# Patient Record
Sex: Male | Born: 1955 | Race: Black or African American | Hispanic: No | Smoking: Never smoker
Health system: Southern US, Community
[De-identification: ages and names within clinical notes are randomized; demographics above are authoritative.]

## PROBLEM LIST (undated history)

## (undated) DIAGNOSIS — M1990 Unspecified osteoarthritis, unspecified site: Secondary | ICD-10-CM

## (undated) DIAGNOSIS — Z8489 Family history of other specified conditions: Secondary | ICD-10-CM

## (undated) DIAGNOSIS — I10 Essential (primary) hypertension: Secondary | ICD-10-CM

## (undated) DIAGNOSIS — M549 Dorsalgia, unspecified: Secondary | ICD-10-CM

## (undated) DIAGNOSIS — E7800 Pure hypercholesterolemia, unspecified: Secondary | ICD-10-CM

## (undated) DIAGNOSIS — G4730 Sleep apnea, unspecified: Secondary | ICD-10-CM

## (undated) HISTORY — PX: OTHER SURGICAL HISTORY: SHX169

## (undated) HISTORY — PX: COLONOSCOPY W/ POLYPECTOMY: SHX1380

## (undated) HISTORY — PX: NO PAST SURGERIES: SHX2092

---

## 2010-03-12 ENCOUNTER — Emergency Department (HOSPITAL_COMMUNITY): Payer: Medicare Other

## 2010-03-12 ENCOUNTER — Emergency Department (HOSPITAL_COMMUNITY)
Admission: EM | Admit: 2010-03-12 | Discharge: 2010-03-12 | Disposition: A | Discharge status: Home / Self Care | Payer: Medicare Other | Attending: Emergency Medicine | Admitting: Emergency Medicine

## 2010-03-12 DIAGNOSIS — R0602 Shortness of breath: Secondary | ICD-10-CM | POA: Insufficient documentation

## 2010-03-12 DIAGNOSIS — Z79899 Other long term (current) drug therapy: Secondary | ICD-10-CM | POA: Insufficient documentation

## 2010-03-12 DIAGNOSIS — R509 Fever, unspecified: Secondary | ICD-10-CM | POA: Insufficient documentation

## 2010-03-12 DIAGNOSIS — R059 Cough, unspecified: Secondary | ICD-10-CM | POA: Insufficient documentation

## 2010-03-12 DIAGNOSIS — E119 Type 2 diabetes mellitus without complications: Secondary | ICD-10-CM | POA: Insufficient documentation

## 2010-03-12 DIAGNOSIS — E039 Hypothyroidism, unspecified: Secondary | ICD-10-CM | POA: Insufficient documentation

## 2010-03-12 DIAGNOSIS — IMO0001 Reserved for inherently not codable concepts without codable children: Secondary | ICD-10-CM | POA: Insufficient documentation

## 2010-03-12 DIAGNOSIS — R05 Cough: Secondary | ICD-10-CM | POA: Insufficient documentation

## 2010-03-12 DIAGNOSIS — I10 Essential (primary) hypertension: Secondary | ICD-10-CM | POA: Insufficient documentation

## 2010-03-12 LAB — DIFFERENTIAL
Lymphs Abs: 0.9 10*3/uL (ref 0.7–4.0)
Monocytes Absolute: 0.3 10*3/uL (ref 0.1–1.0)
Monocytes Relative: 9 % (ref 3–12)
Neutro Abs: 2.3 10*3/uL (ref 1.7–7.7)
Neutrophils Relative %: 63 % (ref 43–77)

## 2010-03-12 LAB — CBC
HCT: 37.4 % — ABNORMAL LOW (ref 39.0–52.0)
Hemoglobin: 12.8 g/dL — ABNORMAL LOW (ref 13.0–17.0)
MCH: 29.9 pg (ref 26.0–34.0)
MCV: 87.4 fL (ref 78.0–100.0)
RBC: 4.28 MIL/uL (ref 4.22–5.81)

## 2010-03-12 LAB — COMPREHENSIVE METABOLIC PANEL
ALT: 32 U/L (ref 0–53)
AST: 48 U/L — ABNORMAL HIGH (ref 0–37)
Albumin: 3.6 g/dL (ref 3.5–5.2)
Alkaline Phosphatase: 92 U/L (ref 39–117)
Chloride: 100 mEq/L (ref 96–112)
Creatinine, Ser: 1.01 mg/dL (ref 0.4–1.5)
GFR calc Af Amer: >60 mL/min (ref >60)
Potassium: 3.8 mEq/L (ref 3.5–5.1)
Sodium: 135 mEq/L (ref 135–145)
Total Bilirubin: 0.7 mg/dL (ref 0.3–1.2)

## 2010-03-12 LAB — POCT CARDIAC MARKERS: Troponin i, poc: <0.05 ng/mL (ref 0.00–0.09)

## 2010-04-14 ENCOUNTER — Emergency Department (HOSPITAL_COMMUNITY)
Admission: EM | Admit: 2010-04-14 | Discharge: 2010-04-15 | Disposition: A | Discharge status: Home / Self Care | Payer: Medicare Other | Attending: Emergency Medicine | Admitting: Emergency Medicine

## 2010-04-14 DIAGNOSIS — R221 Localized swelling, mass and lump, neck: Secondary | ICD-10-CM | POA: Insufficient documentation

## 2010-04-14 DIAGNOSIS — Y92009 Unspecified place in unspecified non-institutional (private) residence as the place of occurrence of the external cause: Secondary | ICD-10-CM | POA: Insufficient documentation

## 2010-04-14 DIAGNOSIS — R51 Headache: Secondary | ICD-10-CM | POA: Insufficient documentation

## 2010-04-14 DIAGNOSIS — F1010 Alcohol abuse, uncomplicated: Secondary | ICD-10-CM | POA: Insufficient documentation

## 2010-04-14 DIAGNOSIS — R220 Localized swelling, mass and lump, head: Secondary | ICD-10-CM | POA: Insufficient documentation

## 2010-04-14 DIAGNOSIS — S022XXA Fracture of nasal bones, initial encounter for closed fracture: Secondary | ICD-10-CM | POA: Insufficient documentation

## 2010-04-14 DIAGNOSIS — I10 Essential (primary) hypertension: Secondary | ICD-10-CM | POA: Insufficient documentation

## 2010-04-15 ENCOUNTER — Emergency Department (HOSPITAL_COMMUNITY): Payer: Medicare Other

## 2010-08-08 ENCOUNTER — Emergency Department (HOSPITAL_COMMUNITY)
Admission: EM | Admit: 2010-08-08 | Discharge: 2010-08-08 | Disposition: A | Discharge status: Home / Self Care | Payer: Medicare Other | Attending: Emergency Medicine | Admitting: Emergency Medicine

## 2010-08-08 DIAGNOSIS — IMO0002 Reserved for concepts with insufficient information to code with codable children: Secondary | ICD-10-CM | POA: Insufficient documentation

## 2010-08-08 DIAGNOSIS — I10 Essential (primary) hypertension: Secondary | ICD-10-CM | POA: Insufficient documentation

## 2010-08-08 DIAGNOSIS — T18108A Unspecified foreign body in esophagus causing other injury, initial encounter: Secondary | ICD-10-CM | POA: Insufficient documentation

## 2010-08-08 DIAGNOSIS — E119 Type 2 diabetes mellitus without complications: Secondary | ICD-10-CM | POA: Insufficient documentation

## 2011-06-02 ENCOUNTER — Emergency Department (HOSPITAL_COMMUNITY)
Admission: EM | Admit: 2011-06-02 | Discharge: 2011-06-02 | Disposition: A | Discharge status: Home / Self Care | Payer: Medicare Other | Attending: Emergency Medicine | Admitting: Emergency Medicine

## 2011-06-02 ENCOUNTER — Encounter (HOSPITAL_COMMUNITY): Payer: Self-pay | Admitting: *Deleted

## 2011-06-02 DIAGNOSIS — E119 Type 2 diabetes mellitus without complications: Secondary | ICD-10-CM | POA: Diagnosis not present

## 2011-06-02 DIAGNOSIS — R0989 Other specified symptoms and signs involving the circulatory and respiratory systems: Secondary | ICD-10-CM

## 2011-06-02 DIAGNOSIS — F449 Dissociative and conversion disorder, unspecified: Secondary | ICD-10-CM | POA: Diagnosis not present

## 2011-06-02 DIAGNOSIS — I10 Essential (primary) hypertension: Secondary | ICD-10-CM | POA: Diagnosis not present

## 2011-06-02 DIAGNOSIS — Z79899 Other long term (current) drug therapy: Secondary | ICD-10-CM | POA: Diagnosis not present

## 2011-06-02 DIAGNOSIS — E079 Disorder of thyroid, unspecified: Secondary | ICD-10-CM | POA: Insufficient documentation

## 2011-06-02 DIAGNOSIS — R6889 Other general symptoms and signs: Secondary | ICD-10-CM | POA: Diagnosis not present

## 2011-06-02 DIAGNOSIS — D649 Anemia, unspecified: Secondary | ICD-10-CM | POA: Insufficient documentation

## 2011-06-02 DIAGNOSIS — E7800 Pure hypercholesterolemia, unspecified: Secondary | ICD-10-CM | POA: Diagnosis not present

## 2011-06-02 HISTORY — DX: Essential (primary) hypertension: I10

## 2011-06-02 HISTORY — DX: Pure hypercholesterolemia, unspecified: E78.00

## 2011-06-02 NOTE — ED Notes (Signed)
Pt states he noticed felt like something stuck in throat last night.  EMS was called out but did not transport due to maintaining O2 sats and able to swallow and talk.  Pt alert and oriented, speaking in full sentences.

## 2011-06-02 NOTE — ED Provider Notes (Signed)
History     CSN: [id number]  Arrival date & time 06/02/11  7846   First MD Initiated Contact with Patient 06/02/11 0700      7:02 AM HPI Eric Wood is 56 y.o. male complaining of the sensation of a foreign body in his throat. Reports last night he woke up to drink some water. States after that is when his throat began to feel irritated. Reports he called EMS but since he was able to drink and swallow without difficulty he was not transported by EMS. States that he came to the ED because EMS did not look in his throat. Reports similar episode several years ago. States he had a pill in his throat and had been advised to drink hot water.  Patient is a 56 y.o. male presenting with foreign body swallowed. The history is provided by the patient.  Swallowed Foreign Body This is a new problem. The current episode started today. The problem has been unchanged. Associated symptoms include abdominal pain. Pertinent negatives include no congestion, coughing, fever, nausea, neck pain, rash, sore throat, swollen glands or vomiting. The symptoms are aggravated by swallowing. He has tried drinking for the symptoms. The treatment provided no relief.    Past Medical History  Diagnosis Date  . Diabetes mellitus   . Hypertension   . Thyroid disease   . Anemia   . Hypercholesteremia     History reviewed. No pertinent past surgical history.  History reviewed. No pertinent family history.  History  Substance Use Topics  . Smoking status: Never Smoker   . Smokeless tobacco: Not on file  . Alcohol Use: Yes      Review of Systems  Constitutional: Negative for fever.  HENT: Positive for trouble swallowing. Negative for congestion, sore throat, drooling, mouth sores, neck pain and voice change.   Respiratory: Negative for cough.   Gastrointestinal: Positive for abdominal pain. Negative for nausea and vomiting.  Skin: Negative for rash.  All other systems reviewed and are negative.    Allergies    Review of patient's allergies indicates no known allergies.  Home Medications   Current Outpatient Rx  Name Route Sig Dispense Refill  . ATENOLOL 25 MG PO TABS Oral Take 25 mg by mouth daily.    Marland Kitchen DICLOFENAC SODIUM 75 MG PO TBEC Oral Take 75 mg by mouth 2 (two) times daily as needed. Pain    . GLIPIZIDE 10 MG PO TABS Oral Take 10 mg by mouth 2 (two) times daily before a meal.    . LEVOTHYROXINE SODIUM 25 MCG PO TABS Oral Take 25 mcg by mouth daily.    Marland Kitchen LISINOPRIL 10 MG PO TABS Oral Take 20 mg by mouth daily.    Marland Kitchen METFORMIN HCL 500 MG PO TABS Oral Take 500 mg by mouth 2 (two) times daily with a meal.    . SIMVASTATIN 10 MG PO TABS Oral Take 10 mg by mouth at bedtime.      BP 170/96  Pulse 112  Temp(Src) 98.2 F (36.8 C) (Oral)  Resp 20  SpO2 98%  Physical Exam  Constitutional: He is oriented to person, place, and time. He appears well-developed and well-nourished.  HENT:  Head: Normocephalic and atraumatic.  Right Ear: Tympanic membrane, external ear and ear canal normal.  Left Ear: Tympanic membrane, external ear and ear canal normal.  Nose: Nose normal.  Mouth/Throat: Uvula is midline, oropharynx is clear and moist and mucous membranes are normal. Normal dentition. No dental abscesses or uvula  swelling. No oropharyngeal exudate, posterior oropharyngeal edema or posterior oropharyngeal erythema.  Eyes: Pupils are equal, round, and reactive to light.  Neck: No muscular tenderness present.  Neurological: He is alert and oriented to person, place, and time.  Skin: Skin is warm and dry. No rash noted. No erythema. No pallor.  Psychiatric: He has a normal mood and affect. His behavior is normal.    ED Course  Procedures  Results for orders placed during the hospital encounter of 06/02/11  RAPID STREP SCREEN      Component Value Range   Streptococcus, Group A Screen (Direct) NEGATIVE  NEGATIVE     MDM   Discussed with patient that imaging will not pick up on food products.  Advised that he continues to drink water and that rapid strep was negative. Advised f/u with PCP or GI if he continues to have symptoms. Pt voices understanding and is ready for d/c     Thomasene Lot, PA-C 06/02/11 2956

## 2011-06-02 NOTE — Discharge Instructions (Signed)
Globus Syndrome  Globus Syndrome is a feeling of a lump or a sensation of something caught in your throat. Eating food or drinking fluids does not seem to get rid of it. Yet it is not noticeable during the actual act of swallowing food or liquids. Usually there is nothing physically wrong. It is troublesome because it is an unpleasant sensation which is sometimes difficult to ignore and at times may seem to worsen. The syndrome is quite common. It is estimated 45% of the population experiences features of the condition at some stage during their lives. The symptoms are usually temporary. The largest group of people who feel the need to seek medical treatment is females between the ages of 30 to 60.   CAUSES   Globus Syndrome appears to be triggered by or aggravated by stress, anxiety and depression.   Tension related to stress could product abnormal muscle spasms in the esophagus which would account for the sensation of a lump or ball in your throat.   Frequent swallowing or drying of the throat caused by anxiety or other strong emotions can also produce this uncomfortable sensation in your throat.   Fear and sadness can be expressed by the body in many ways. For instance, if you had a relative with throat cancer you might become overly concerned about your own health and develop uncomfortable sensations in your throat.   The reaction to a crisis or a trauma event in your life can take the form of a lump in your throat. It is as if you are indirectly saying you can not handle or "swallow" one more thing.  DIAGNOSIS   Usually your caregiver will know what is wrong by talking to you and examining you.  If the condition persists for several days, more testing may be done to make sure there is not another problem present. This is usually not the case.  TREATMENT    Reassurance is often the best treatment available. Usually the problem leaves without treatment over several days.   Sometimes anti-anxiety medications  may be prescribed.   Counseling or talk therapy can also help with strong underlying emotions.   Note that in most cases this is not something that keeps coming back and you should not be concerned or worried.  Document Released: 03/10/2003 Document Revised: 12/07/2010 Document Reviewed: 08/07/2007  [employer] Patient Information 2012 [employer].

## 2011-06-02 NOTE — ED Provider Notes (Signed)
Medical screening examination/treatment/procedure(s) were performed by non-physician practitioner and as supervising physician I was immediately available for consultation/collaboration.  Cheri Guppy, MD 06/02/11 [phone]

## 2012-02-07 ENCOUNTER — Encounter (HOSPITAL_COMMUNITY): Payer: Self-pay | Admitting: Emergency Medicine

## 2012-02-07 ENCOUNTER — Emergency Department (HOSPITAL_COMMUNITY): Payer: Medicare Other

## 2012-02-07 ENCOUNTER — Emergency Department (HOSPITAL_COMMUNITY)
Admission: EM | Admit: 2012-02-07 | Discharge: 2012-02-08 | Disposition: A | Discharge status: Home / Self Care | Payer: Medicare Other | Attending: Emergency Medicine | Admitting: Emergency Medicine

## 2012-02-07 DIAGNOSIS — R441 Visual hallucinations: Secondary | ICD-10-CM

## 2012-02-07 DIAGNOSIS — R6889 Other general symptoms and signs: Secondary | ICD-10-CM | POA: Diagnosis not present

## 2012-02-07 DIAGNOSIS — E7800 Pure hypercholesterolemia, unspecified: Secondary | ICD-10-CM | POA: Insufficient documentation

## 2012-02-07 DIAGNOSIS — Z862 Personal history of diseases of the blood and blood-forming organs and certain disorders involving the immune mechanism: Secondary | ICD-10-CM | POA: Insufficient documentation

## 2012-02-07 DIAGNOSIS — H5316 Psychophysical visual disturbances: Secondary | ICD-10-CM | POA: Insufficient documentation

## 2012-02-07 DIAGNOSIS — E119 Type 2 diabetes mellitus without complications: Secondary | ICD-10-CM | POA: Insufficient documentation

## 2012-02-07 DIAGNOSIS — R443 Hallucinations, unspecified: Secondary | ICD-10-CM | POA: Diagnosis not present

## 2012-02-07 DIAGNOSIS — Z79899 Other long term (current) drug therapy: Secondary | ICD-10-CM | POA: Insufficient documentation

## 2012-02-07 DIAGNOSIS — I10 Essential (primary) hypertension: Secondary | ICD-10-CM | POA: Insufficient documentation

## 2012-02-07 DIAGNOSIS — F1410 Cocaine abuse, uncomplicated: Secondary | ICD-10-CM | POA: Insufficient documentation

## 2012-02-07 DIAGNOSIS — E785 Hyperlipidemia, unspecified: Secondary | ICD-10-CM | POA: Diagnosis not present

## 2012-02-07 DIAGNOSIS — E079 Disorder of thyroid, unspecified: Secondary | ICD-10-CM | POA: Insufficient documentation

## 2012-02-07 LAB — COMPREHENSIVE METABOLIC PANEL
ALT: 28 U/L (ref 0–53)
AST: 42 U/L — ABNORMAL HIGH (ref 0–37)
Albumin: 4.1 g/dL (ref 3.5–5.2)
Alkaline Phosphatase: 77 U/L (ref 39–117)
Chloride: 97 mEq/L (ref 96–112)
Potassium: 3.2 mEq/L — ABNORMAL LOW (ref 3.5–5.1)
Total Bilirubin: 1.1 mg/dL (ref 0.3–1.2)

## 2012-02-07 LAB — CBC WITH DIFFERENTIAL/PLATELET
Basophils Absolute: 0 10*3/uL (ref 0.0–0.1)
Basophils Relative: 0 % (ref 0–1)
Hemoglobin: 13.4 g/dL (ref 13.0–17.0)
MCHC: 35.1 g/dL (ref 30.0–36.0)
Monocytes Relative: 8 % (ref 3–12)
Neutro Abs: 7 10*3/uL (ref 1.7–7.7)
Neutrophils Relative %: 72 % (ref 43–77)

## 2012-02-07 LAB — RAPID URINE DRUG SCREEN, HOSP PERFORMED
Barbiturates: NOT DETECTED
Cocaine: POSITIVE — AB
Tetrahydrocannabinol: POSITIVE — AB

## 2012-02-07 MED ORDER — POTASSIUM CHLORIDE 20 MEQ/15ML (10%) PO LIQD
40.0000 meq | Freq: Once | ORAL | Status: AC
  Administered 2012-02-07: 40 meq via ORAL
  Filled 2012-02-07: qty 30

## 2012-02-07 NOTE — ED Notes (Signed)
Pt to CT

## 2012-02-07 NOTE — ED Notes (Signed)
Eric Wood [phone]

## 2012-02-07 NOTE — ED Notes (Signed)
Meade Hogeland, pt's brother left contact info (cell) [phone], brother reports that he is going home, will be back to pick pt up.

## 2012-02-07 NOTE — ED Notes (Signed)
Pt changed into scrubs; wanded by security; belongings bag including wallet and watch given to brother

## 2012-02-08 DIAGNOSIS — F1999 Other psychoactive substance use, unspecified with unspecified psychoactive substance-induced disorder: Secondary | ICD-10-CM

## 2012-02-08 DIAGNOSIS — F1410 Cocaine abuse, uncomplicated: Secondary | ICD-10-CM

## 2012-02-08 MED ORDER — DICLOFENAC SODIUM 75 MG PO TBEC
75.0000 mg | DELAYED_RELEASE_TABLET | Freq: Two times a day (BID) | ORAL | Status: DC | PRN
  Filled 2012-02-08: qty 1

## 2012-02-08 MED ORDER — METFORMIN HCL 500 MG PO TABS: 500.0000 mg | ORAL_TABLET | Freq: Two times a day (BID) | ORAL | Status: DC

## 2012-02-08 MED ORDER — LISINOPRIL 20 MG PO TABS
20.0000 mg | ORAL_TABLET | Freq: Every day | ORAL | Status: DC
  Administered 2012-02-08: 20 mg via ORAL
  Filled 2012-02-08: qty 1

## 2012-02-08 MED ORDER — IBUPROFEN 800 MG PO TABS
800.0000 mg | ORAL_TABLET | Freq: Once | ORAL | Status: AC
  Administered 2012-02-08: 800 mg via ORAL
  Filled 2012-02-08: qty 1

## 2012-02-08 MED ORDER — LEVOTHYROXINE SODIUM 25 MCG PO TABS
25.0000 ug | ORAL_TABLET | Freq: Every day | ORAL | Status: DC
  Administered 2012-02-08: 25 ug via ORAL
  Filled 2012-02-08 (×2): qty 1

## 2012-02-08 MED ORDER — ATENOLOL 25 MG PO TABS
25.0000 mg | ORAL_TABLET | Freq: Every day | ORAL | Status: DC
  Administered 2012-02-08: 25 mg via ORAL
  Filled 2012-02-08: qty 1

## 2012-02-08 MED ORDER — SIMVASTATIN 10 MG PO TABS
10.0000 mg | ORAL_TABLET | Freq: Every day | ORAL | Status: DC
  Filled 2012-02-08: qty 1

## 2012-02-08 MED ORDER — GLIPIZIDE 10 MG PO TABS
10.0000 mg | ORAL_TABLET | Freq: Two times a day (BID) | ORAL | Status: DC
  Administered 2012-02-08: 10 mg via ORAL
  Filled 2012-02-08 (×3): qty 1

## 2012-02-08 NOTE — ED Provider Notes (Signed)
Pt stable awaiting placement   Eric Lennert, MD 02/08/12 [phone]

## 2012-02-08 NOTE — [facility] Counselor (Signed)
Patient was reviewed by Verne Spurr, PA and declined due to lack of inpatient criteria.

## 2012-02-08 NOTE — Consult Note (Signed)
Reason for Consult: Hallucinations secondary to cocaine intoxication Referring Physician: Dr. Karlyn Eric Wood is an 57 y.o. male.  HPI: Patient was seen and chart reviewed. Patient was presented to the listed on the emergency department with the severe right leg pain and hallucinations after smoking crack cocaine for the worth of $60. Patient reported he does not use drugs all the time not dependent but uses infrequently. Patient does send [place] his being a patient at [facility] for both medical and psychiatric to your patient denies any symptoms of dizziness severe leg pain shortness of breath hallucinations suicidal homicidal ideations intents or plans. Patient was able to contract for safety and is using that screen was positive for cocaine and tetrahydrocannabinol.  MSE: Patient was well-developed well-nourished, awake alert and oriented x4. Patient states her mood is fine his affect was appropriate he is normal speech and thought process he has no suicidal ideation he has no evidence of psychotic symptoms.  Past Medical History  Diagnosis Date  . Diabetes mellitus   . Hypertension   . Thyroid disease   . Anemia   . Hypercholesteremia     History reviewed. No pertinent past surgical history.  No family history on file.  Social History:  reports that he has never smoked. He does not have any smokeless tobacco history on file. He reports that he uses illicit drugs ("Crack" cocaine). He reports that he does not drink alcohol.  Allergies: No Known Allergies  Medications: I have reviewed the patient's current medications.  Results for orders placed during the hospital encounter of 02/07/12 (from the past 48 hour(s))  URINE RAPID DRUG SCREEN (HOSP PERFORMED)     Status: Abnormal   Collection Time   02/07/12  4:49 PM      Component Value Range Comment   Opiates NONE DETECTED  NONE DETECTED    Cocaine POSITIVE (*) NONE DETECTED    Benzodiazepines NONE DETECTED  NONE DETECTED     Amphetamines NONE DETECTED  NONE DETECTED    Tetrahydrocannabinol POSITIVE (*) NONE DETECTED    Barbiturates NONE DETECTED  NONE DETECTED   CBC WITH DIFFERENTIAL     Status: Abnormal   Collection Time   02/07/12  7:45 PM      Component Value Range Comment   WBC 9.7  4.0 - 10.5 K/uL    RBC 4.26  4.22 - 5.81 MIL/uL    Hemoglobin 13.4  13.0 - 17.0 g/dL    HCT 16.1 (*) 09.6 - 52.0 %    MCV 89.7  78.0 - 100.0 fL    MCH 31.5  26.0 - 34.0 pg    MCHC 35.1  30.0 - 36.0 g/dL    RDW 04.5  40.9 - 81.1 %    Platelets 309  150 - 400 K/uL    Neutrophils Relative 72  43 - 77 %    Neutro Abs 7.0  1.7 - 7.7 K/uL    Lymphocytes Relative 19  12 - 46 %    Lymphs Abs 1.9  0.7 - 4.0 K/uL    Monocytes Relative 8  3 - 12 %    Monocytes Absolute 0.8  0.1 - 1.0 K/uL    Eosinophils Relative 0  0 - 5 %    Eosinophils Absolute 0.0  0.0 - 0.7 K/uL    Basophils Relative 0  0 - 1 %    Basophils Absolute 0.0  0.0 - 0.1 K/uL   COMPREHENSIVE METABOLIC PANEL     Status:  Abnormal   Collection Time   02/07/12  7:45 PM      Component Value Range Comment   Sodium 136  135 - 145 mEq/L    Potassium 3.2 (*) 3.5 - 5.1 mEq/L    Chloride 97  96 - 112 mEq/L    CO2 24  19 - 32 mEq/L    Glucose, Bld 171 (*) 70 - 99 mg/dL    BUN 17  6 - 23 mg/dL    Creatinine, Ser 1.61 (*) 0.50 - 1.35 mg/dL    Calcium 9.3  8.4 - 09.6 mg/dL    Total Protein 8.3  6.0 - 8.3 g/dL    Albumin 4.1  3.5 - 5.2 g/dL    AST 42 (*) 0 - 37 U/L    ALT 28  0 - 53 U/L    Alkaline Phosphatase 77  39 - 117 U/L    Total Bilirubin 1.1  0.3 - 1.2 mg/dL    GFR calc non Af Amer 49 (*) >90 mL/min    GFR calc Af Amer 57 (*) >90 mL/min   ETHANOL     Status: Normal   Collection Time   02/07/12  7:45 PM      Component Value Range Comment   Alcohol, Ethyl (B) <11  0 - 11 mg/dL   GLUCOSE, CAPILLARY     Status: Abnormal   Collection Time   02/07/12 10:09 PM      Component Value Range Comment   Glucose-Capillary 115 (*) 70 - 99 mg/dL     Ct Head Wo  Contrast  02/07/2012  *RADIOLOGY REPORT*  Clinical Data: Hallucinations.  Diabetic hypertensive patient with hyperlipidemia.  CT HEAD WITHOUT CONTRAST  Technique:  Contiguous axial images were obtained from the base of the skull through the vertex without contrast.  Comparison: 04/15/2010.  Findings: No intracranial hemorrhage.  No CT evidence of large acute infarct.  No intracranial mass lesion detected on this unenhanced exam.  No hydrocephalus.  Polypoid opacification right maxillary sinus.  IMPRESSION: Polypoid opacification right maxillary sinus.  No acute abnormality.   Original Report Authenticated By: Lacy Duverney, M.D.     Positive for illegal drug usage Blood pressure 103/56, pulse 90, temperature 98.8 F (37.1 C), temperature source Oral, resp. rate 18, SpO2 99.00%.   Assessment/Plan: Cocaine intoxication Substance induced psychosis  Recommendations: Patient does not be criteria for acute psychiatric hospitalization as he was cleared psychosis which was induced by cocaine. Patient was able to contract for safety so referred with outpatient psychiatric services at [facility] the  [facility] R. 02/08/2012, 9:26 AM
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
## 2012-02-08 NOTE — ED Provider Notes (Signed)
History     CSN: [id number]  Arrival date & time 02/07/12  1620   First MD Initiated Contact with Patient 02/07/12 2235      Chief Complaint  Patient presents with  . Hallucinations     The history is provided by the patient and a relative. The history is limited by the condition of the patient (psychosis).   Pt was seen at 2250.  Per pt, c/o gradual onset and persistence of constant "hallucinations" that began after he smoked crack earlier today. States he saw "a bear on the wall" and "a spirit hit my leg while I was in the bathroom" at home PTA. States he smokes crack every day.  States he often sees "animals" or "people getting beaten."  Denies HI, no SI.     Past Medical History  Diagnosis Date  . Diabetes mellitus   . Hypertension   . Thyroid disease   . Anemia   . Hypercholesteremia     History reviewed. No pertinent past surgical history.   History  Substance Use Topics  . Smoking status: Never Smoker   . Smokeless tobacco: Not on file  . Alcohol Use: No      Review of Systems  Unable to perform ROS: Psychiatric disorder    Allergies  Review of patient's allergies indicates no known allergies.  Home Medications   Current Outpatient Rx  Name  Route  Sig  Dispense  Refill  . ATENOLOL 25 MG PO TABS   Oral   Take 25 mg by mouth daily.         Marland Kitchen DICLOFENAC SODIUM 75 MG PO TBEC   Oral   Take 75 mg by mouth 2 (two) times daily as needed. Pain         . GLIPIZIDE 10 MG PO TABS   Oral   Take 10 mg by mouth 2 (two) times daily before a meal.         . LEVOTHYROXINE SODIUM 25 MCG PO TABS   Oral   Take 25 mcg by mouth daily.         Marland Kitchen LISINOPRIL 10 MG PO TABS   Oral   Take 20 mg by mouth daily.         Marland Kitchen METFORMIN HCL 500 MG PO TABS   Oral   Take 500 mg by mouth 2 (two) times daily with a meal.         . SIMVASTATIN 10 MG PO TABS   Oral   Take 10 mg by mouth at bedtime.           BP 104/64  Pulse 98  Temp 99 F (37.2 C)  (Oral)  Resp 18  SpO2 96%  Physical Exam 2255: Physical examination:  Nursing notes reviewed; Vital signs and O2 SAT reviewed;  Constitutional: Disheveled,  In no acute distress; Head:  Normocephalic, atraumatic; Eyes: EOMI, PERRL; ENMT: Mouth and pharynx normal, Mucous membranes moist; Neck: Supple, Full range of motion; Cardiovascular: Regular rate and rhythm; Respiratory: Breath sounds clear & equal, no wheezes.  Speaking full sentences with ease, Normal respiratory effort/excursion; Chest: No deformity, Movement normal; Abdomen: Normal bowel sounds; Extremities: Pulses normal, No deformity, No edema, No calf edema or asymmetry.; Neuro: Awake, alert, Major CN grossly intact.  Speech clear. No gross focal motor or sensory deficits in extremities.; Skin: Color normal, Warm, Dry.; Psych:  Rapid, pressured speech, +hallucinations.    ED Course  Procedures      MDM  MDM  Reviewed: previous chart, nursing note and vitals Interpretation: labs and CT scan   Results for orders placed during the hospital encounter of 02/07/12  URINE RAPID DRUG SCREEN (HOSP PERFORMED)      Component Value Range   Opiates NONE DETECTED  NONE DETECTED   Cocaine POSITIVE (*) NONE DETECTED   Benzodiazepines NONE DETECTED  NONE DETECTED   Amphetamines NONE DETECTED  NONE DETECTED   Tetrahydrocannabinol POSITIVE (*) NONE DETECTED   Barbiturates NONE DETECTED  NONE DETECTED  CBC WITH DIFFERENTIAL      Component Value Range   WBC 9.7  4.0 - 10.5 K/uL   RBC 4.26  4.22 - 5.81 MIL/uL   Hemoglobin 13.4  13.0 - 17.0 g/dL   HCT 16.1 (*) 09.6 - 04.5 %   MCV 89.7  78.0 - 100.0 fL   MCH 31.5  26.0 - 34.0 pg   MCHC 35.1  30.0 - 36.0 g/dL   RDW 40.9  81.1 - 91.4 %   Platelets 309  150 - 400 K/uL   Neutrophils Relative 72  43 - 77 %   Neutro Abs 7.0  1.7 - 7.7 K/uL   Lymphocytes Relative 19  12 - 46 %   Lymphs Abs 1.9  0.7 - 4.0 K/uL   Monocytes Relative 8  3 - 12 %   Monocytes Absolute 0.8  0.1 - 1.0 K/uL    Eosinophils Relative 0  0 - 5 %   Eosinophils Absolute 0.0  0.0 - 0.7 K/uL   Basophils Relative 0  0 - 1 %   Basophils Absolute 0.0  0.0 - 0.1 K/uL  COMPREHENSIVE METABOLIC PANEL      Component Value Range   Sodium 136  135 - 145 mEq/L   Potassium 3.2 (*) 3.5 - 5.1 mEq/L   Chloride 97  96 - 112 mEq/L   CO2 24  19 - 32 mEq/L   Glucose, Bld 171 (*) 70 - 99 mg/dL   BUN 17  6 - 23 mg/dL   Creatinine, Ser 7.82 (*) 0.50 - 1.35 mg/dL   Calcium 9.3  8.4 - 95.6 mg/dL   Total Protein 8.3  6.0 - 8.3 g/dL   Albumin 4.1  3.5 - 5.2 g/dL   AST 42 (*) 0 - 37 U/L   ALT 28  0 - 53 U/L   Alkaline Phosphatase 77  39 - 117 U/L   Total Bilirubin 1.1  0.3 - 1.2 mg/dL   GFR calc non Af Amer 49 (*) >90 mL/min   GFR calc Af Amer 57 (*) >90 mL/min  ETHANOL      Component Value Range   Alcohol, Ethyl (B) <11  0 - 11 mg/dL  GLUCOSE, CAPILLARY      Component Value Range   Glucose-Capillary 115 (*) 70 - 99 mg/dL   Ct Head Wo Contrast 02/02/3084  *RADIOLOGY REPORT*  Clinical Data: Hallucinations.  Diabetic hypertensive patient with hyperlipidemia.  CT HEAD WITHOUT CONTRAST  Technique:  Contiguous axial images were obtained from the base of the skull through the vertex without contrast.  Comparison: 04/15/2010.  Findings: No intracranial hemorrhage.  No CT evidence of large acute infarct.  No intracranial mass lesion detected on this unenhanced exam.  No hydrocephalus.  Polypoid opacification right maxillary sinus.  IMPRESSION: Polypoid opacification right maxillary sinus.  No acute abnormality.   Original Report Authenticated By: Lacy Duverney, M.D.     0100:  Potassium repleted PO.  Pt eval by ACT Terri: feels pt  may be hallucinating due to daily crack abuse.  Will need telepsych vs re-eval in the morning.         Laray Anger, DO 02/08/12 [phone]

## 2012-02-08 NOTE — [facility] Suicide Risk Assessment (Signed)
Suicide Risk Assessment  Discharge Assessment     Demographic Factors:  Male, Adolescent or young adult, Low socioeconomic status and Living alone  Mental Status Per Nursing Assessment::   On Admission:     Current Mental Status by Physician: Patient is free from hallucinations delusions paranoia, suicidal homicidal ideations  Loss Factors: Financial problems/change in socioeconomic status  Historical Factors: NA  Risk Reduction Factors:   Sense of responsibility to family, Religious beliefs about death, Positive social support and Positive therapeutic relationship  Continued Clinical Symptoms:  Alcohol/Substance Abuse/Dependencies  Cognitive Features That Contribute To Risk:  Polarized thinking    Suicide Risk:  Minimal: No identifiable suicidal ideation.  Patients presenting with no risk factors but with morbid ruminations; may be classified as minimal risk based on the severity of the depressive symptoms  Discharge Diagnoses:   AXIS I:  Substance Induced Mood Disorder and Cocaine intoxication AXIS II:  Deferred AXIS III:   Past Medical History  Diagnosis Date  . Diabetes mellitus   . Hypertension   . Thyroid disease   . Anemia   . Hypercholesteremia    AXIS IV:  economic problems, occupational problems, other psychosocial or environmental problems and problems related to social environment AXIS V:  61-70 mild symptoms  Plan Of Care/Follow-up recommendations:  Activity:  As tolerated Diet:  Regular  Is patient on multiple antipsychotic therapies at discharge:  No   Has Patient had three or more failed trials of antipsychotic monotherapy by history:  No  Recommended Plan for Multiple Antipsychotic Therapies: Not applicable  Anabela Crayton,JANARDHAHA R. 02/08/2012, 11:54 AM

## 2012-02-08 NOTE — ED Notes (Signed)
Waiting for ride home  

## 2014-03-09 DIAGNOSIS — E559 Vitamin D deficiency, unspecified: Secondary | ICD-10-CM | POA: Diagnosis not present

## 2014-03-09 DIAGNOSIS — R972 Elevated prostate specific antigen [PSA]: Secondary | ICD-10-CM | POA: Diagnosis not present

## 2014-03-09 DIAGNOSIS — M545 Low back pain: Secondary | ICD-10-CM | POA: Diagnosis not present

## 2014-03-09 DIAGNOSIS — R2681 Unsteadiness on feet: Secondary | ICD-10-CM | POA: Diagnosis not present

## 2014-03-09 DIAGNOSIS — E119 Type 2 diabetes mellitus without complications: Secondary | ICD-10-CM | POA: Diagnosis not present

## 2014-03-09 DIAGNOSIS — I10 Essential (primary) hypertension: Secondary | ICD-10-CM | POA: Diagnosis not present

## 2014-04-06 DIAGNOSIS — Z0000 Encounter for general adult medical examination without abnormal findings: Secondary | ICD-10-CM | POA: Diagnosis not present

## 2014-04-06 DIAGNOSIS — Z01118 Encounter for examination of ears and hearing with other abnormal findings: Secondary | ICD-10-CM | POA: Diagnosis not present

## 2014-04-06 DIAGNOSIS — Z1389 Encounter for screening for other disorder: Secondary | ICD-10-CM | POA: Diagnosis not present

## 2014-04-06 DIAGNOSIS — Z0100 Encounter for examination of eyes and vision without abnormal findings: Secondary | ICD-10-CM | POA: Diagnosis not present

## 2014-04-06 DIAGNOSIS — H538 Other visual disturbances: Secondary | ICD-10-CM | POA: Diagnosis not present

## 2014-04-06 DIAGNOSIS — I10 Essential (primary) hypertension: Secondary | ICD-10-CM | POA: Diagnosis not present

## 2014-04-06 DIAGNOSIS — M545 Low back pain: Secondary | ICD-10-CM | POA: Diagnosis not present

## 2014-04-06 DIAGNOSIS — E119 Type 2 diabetes mellitus without complications: Secondary | ICD-10-CM | POA: Diagnosis not present

## 2014-04-08 DIAGNOSIS — M4716 Other spondylosis with myelopathy, lumbar region: Secondary | ICD-10-CM | POA: Diagnosis not present

## 2014-04-08 DIAGNOSIS — Z6829 Body mass index (BMI) 29.0-29.9, adult: Secondary | ICD-10-CM | POA: Diagnosis not present

## 2014-04-08 DIAGNOSIS — M4726 Other spondylosis with radiculopathy, lumbar region: Secondary | ICD-10-CM | POA: Diagnosis not present

## 2014-04-08 DIAGNOSIS — M160 Bilateral primary osteoarthritis of hip: Secondary | ICD-10-CM | POA: Diagnosis not present

## 2014-04-08 DIAGNOSIS — M4316 Spondylolisthesis, lumbar region: Secondary | ICD-10-CM | POA: Diagnosis not present

## 2014-05-05 DIAGNOSIS — M1612 Unilateral primary osteoarthritis, left hip: Secondary | ICD-10-CM | POA: Diagnosis not present

## 2014-05-05 DIAGNOSIS — M1611 Unilateral primary osteoarthritis, right hip: Secondary | ICD-10-CM | POA: Diagnosis not present

## 2014-05-05 DIAGNOSIS — M25552 Pain in left hip: Secondary | ICD-10-CM | POA: Diagnosis not present

## 2014-05-05 DIAGNOSIS — M25551 Pain in right hip: Secondary | ICD-10-CM | POA: Diagnosis not present

## 2014-06-02 ENCOUNTER — Other Ambulatory Visit (HOSPITAL_COMMUNITY): Payer: Self-pay | Admitting: Orthopaedic Surgery

## 2014-06-02 ENCOUNTER — Other Ambulatory Visit (HOSPITAL_COMMUNITY): Payer: Self-pay

## 2014-06-07 ENCOUNTER — Encounter (HOSPITAL_COMMUNITY): Payer: Self-pay

## 2014-06-07 ENCOUNTER — Encounter (HOSPITAL_COMMUNITY)
Admission: RE | Admit: 2014-06-07 | Discharge: 2014-06-07 | Disposition: A | Discharge status: Home / Self Care | Payer: Medicare Other | Source: Ambulatory Visit | Attending: Orthopaedic Surgery | Admitting: Orthopaedic Surgery

## 2014-06-07 DIAGNOSIS — M1611 Unilateral primary osteoarthritis, right hip: Secondary | ICD-10-CM | POA: Diagnosis not present

## 2014-06-07 DIAGNOSIS — Z01812 Encounter for preprocedural laboratory examination: Secondary | ICD-10-CM | POA: Diagnosis not present

## 2014-06-07 HISTORY — DX: Unspecified osteoarthritis, unspecified site: M19.90

## 2014-06-07 HISTORY — DX: Dorsalgia, unspecified: M54.9

## 2014-06-07 LAB — BASIC METABOLIC PANEL
ANION GAP: 8 (ref 5–15)
BUN: 12 mg/dL (ref 6–20)
CHLORIDE: 106 mmol/L (ref 101–111)
CO2: 28 mmol/L (ref 22–32)
CREATININE: 1.04 mg/dL (ref 0.61–1.24)
Calcium: 9.3 mg/dL (ref 8.9–10.3)
GLUCOSE: 179 mg/dL — AB (ref 65–99)
POTASSIUM: 3.8 mmol/L (ref 3.5–5.1)
SODIUM: 142 mmol/L (ref 135–145)

## 2014-06-07 LAB — CBC
HEMATOCRIT: 40.5 % (ref 39.0–52.0)
Hemoglobin: 13.2 g/dL (ref 13.0–17.0)
MCH: 30.3 pg (ref 26.0–34.0)
MCHC: 32.6 g/dL (ref 30.0–36.0)
MCV: 93.1 fL (ref 78.0–100.0)
Platelets: 425 10*3/uL — ABNORMAL HIGH (ref 150–400)
RBC: 4.35 MIL/uL (ref 4.22–5.81)
RDW: 13.6 % (ref 11.5–15.5)
WBC: 4.9 10*3/uL (ref 4.0–10.5)

## 2014-06-07 LAB — PROTIME-INR
INR: 1.05 (ref 0.00–1.49)
PROTHROMBIN TIME: 13.9 s (ref 11.6–15.2)

## 2014-06-07 LAB — APTT: aPTT: 32 seconds (ref 24–37)

## 2014-06-07 LAB — SURGICAL PCR SCREEN
MRSA, PCR: NEGATIVE
STAPHYLOCOCCUS AUREUS: NEGATIVE

## 2014-06-07 NOTE — Patient Instructions (Addendum)
Eric Wood  06/07/2014   Your procedure is scheduled on: Friday 06/11/14  Report to [facility] Main  Entrance and follow signs to               [name] at 05:30 AM.  Call this number if you have problems the morning of surgery [phone]   Remember: ONLY 1 PERSON MAY GO WITH YOU TO SHORT STAY TO GET  READY MORNING OF [name].  Do not eat food or drink liquids :After Midnight.   Take these medicines the morning of surgery with A SIP OF WATER: atenolol                               You may not have any metal on your body including hair pins and              piercings  Do not wear jewelry, make-up, lotions, powders or perfumes, deodorant             Do not wear nail polish.  Do not shave  48 hours prior to surgery.              Men may shave face and neck.  Do not bring valuables to the hospital. [name].  Contacts, dentures or bridgework may not be worn into surgery.  Leave suitcase in the car. After surgery it may be brought to your room.               Please read over the following fact sheets you were given: MRSA information  _____________________________________________________________________           [facility] - Preparing for Surgery Before surgery, you can play an important role.  Because skin is not sterile, your skin needs to be as free of germs as possible.  You can reduce the number of germs on your skin by washing with CHG (chlorahexidine gluconate) soap before surgery.  CHG is an antiseptic cleaner which kills germs and bonds with the skin to continue killing germs even after washing. Please DO NOT use if you have an allergy to CHG or antibacterial soaps.  If your skin becomes reddened/irritated stop using the CHG and inform your nurse when you arrive at Short Stay. Do not shave (including legs and underarms) for at least 48 hours prior to the first CHG shower.  You may shave your  face/neck. Please follow these instructions carefully:  1.  Shower with CHG Soap the night before surgery and the  morning of Surgery.  2.  If you choose to wash your hair, wash your hair first as usual with your  normal  shampoo.  3.  After you shampoo, rinse your hair and body thoroughly to remove the  shampoo.                            4.  Use CHG as you would any other liquid soap.  You can apply chg directly  to the skin and wash                       Gently with a scrungie or clean washcloth.  5.  Apply the  CHG Soap to your body ONLY FROM THE NECK DOWN.   Do not use on face/ open                           Wound or open sores. Avoid contact with eyes, ears mouth and genitals (private parts).                       Wash face,  Genitals (private parts) with your normal soap.             6.  Wash thoroughly, paying special attention to the area where your surgery  will be performed.  7.  Thoroughly rinse your body with warm water from the neck down.  8.  DO NOT shower/wash with your normal soap after using and rinsing off  the CHG Soap.                9.  Pat yourself dry with a clean towel.            10.  Wear clean pajamas.            11.  Place clean sheets on your bed the night of your first shower and do not  sleep with pets. Day of Surgery : Do not apply any lotions/deodorants the morning of surgery.  Please wear clean clothes to the hospital/surgery center.  FAILURE TO FOLLOW THESE INSTRUCTIONS MAY RESULT IN THE CANCELLATION OF YOUR SURGERY PATIENT SIGNATURE_________________________________  NURSE SIGNATURE__________________________________  ________________________________________________________________________

## 2014-06-07 NOTE — Progress Notes (Signed)
EKG 04/06/14 on chart

## 2014-06-09 DIAGNOSIS — I119 Hypertensive heart disease without heart failure: Secondary | ICD-10-CM | POA: Diagnosis not present

## 2014-06-09 DIAGNOSIS — I10 Essential (primary) hypertension: Secondary | ICD-10-CM | POA: Diagnosis not present

## 2014-06-10 NOTE — Progress Notes (Signed)
Called and request ECHO results from 06/09/2014 x 2 from office of Dr Beverely Risen.  Left messages x 2.  Also on 2nd request requested any other labs or tests done 06/09/2014.

## 2014-06-11 ENCOUNTER — Encounter (HOSPITAL_COMMUNITY): Payer: Self-pay

## 2014-06-11 ENCOUNTER — Inpatient Hospital Stay (HOSPITAL_COMMUNITY): Payer: Medicare Other | Admitting: Anesthesiology

## 2014-06-11 ENCOUNTER — Inpatient Hospital Stay (HOSPITAL_COMMUNITY): Payer: Medicare Other

## 2014-06-11 ENCOUNTER — Inpatient Hospital Stay (HOSPITAL_COMMUNITY)
Admission: RE | Admit: 2014-06-11 | Discharge: 2014-06-14 | DRG: 470 | Disposition: A | Discharge status: Home Health | Payer: Medicare Other | Source: Ambulatory Visit | Attending: Orthopaedic Surgery | Admitting: Orthopaedic Surgery

## 2014-06-11 ENCOUNTER — Encounter (HOSPITAL_COMMUNITY)
Admission: RE | Disposition: A | Discharge status: Home Health | Payer: Self-pay | Source: Ambulatory Visit | Attending: Orthopaedic Surgery

## 2014-06-11 DIAGNOSIS — M1611 Unilateral primary osteoarthritis, right hip: Secondary | ICD-10-CM | POA: Diagnosis not present

## 2014-06-11 DIAGNOSIS — Z01812 Encounter for preprocedural laboratory examination: Secondary | ICD-10-CM

## 2014-06-11 DIAGNOSIS — E119 Type 2 diabetes mellitus without complications: Secondary | ICD-10-CM | POA: Diagnosis present

## 2014-06-11 DIAGNOSIS — Z96641 Presence of right artificial hip joint: Secondary | ICD-10-CM

## 2014-06-11 DIAGNOSIS — Z471 Aftercare following joint replacement surgery: Secondary | ICD-10-CM | POA: Diagnosis not present

## 2014-06-11 DIAGNOSIS — D62 Acute posthemorrhagic anemia: Secondary | ICD-10-CM | POA: Diagnosis not present

## 2014-06-11 DIAGNOSIS — I10 Essential (primary) hypertension: Secondary | ICD-10-CM | POA: Diagnosis present

## 2014-06-11 DIAGNOSIS — M169 Osteoarthritis of hip, unspecified: Secondary | ICD-10-CM | POA: Diagnosis not present

## 2014-06-11 DIAGNOSIS — M879 Osteonecrosis, unspecified: Secondary | ICD-10-CM | POA: Diagnosis present

## 2014-06-11 DIAGNOSIS — Z419 Encounter for procedure for purposes other than remedying health state, unspecified: Secondary | ICD-10-CM

## 2014-06-11 DIAGNOSIS — M25551 Pain in right hip: Secondary | ICD-10-CM | POA: Diagnosis present

## 2014-06-11 DIAGNOSIS — G4730 Sleep apnea, unspecified: Secondary | ICD-10-CM | POA: Diagnosis present

## 2014-06-11 HISTORY — PX: TOTAL HIP ARTHROPLASTY: SHX124

## 2014-06-11 HISTORY — DX: Family history of other specified conditions: Z84.89

## 2014-06-11 LAB — TYPE AND SCREEN
ABO/RH(D): A POS
ANTIBODY SCREEN: NEGATIVE

## 2014-06-11 LAB — GLUCOSE, CAPILLARY
Glucose-Capillary: 134 mg/dL — ABNORMAL HIGH (ref 65–99)
Glucose-Capillary: 121 mg/dL — ABNORMAL HIGH (ref 65–99)
Glucose-Capillary: 156 mg/dL — ABNORMAL HIGH (ref 65–99)
Glucose-Capillary: 92 mg/dL (ref 65–99)

## 2014-06-11 LAB — ABO/RH: ABO/RH(D): A POS

## 2014-06-11 SURGERY — ARTHROPLASTY, HIP, TOTAL, ANTERIOR APPROACH
Anesthesia: Monitor Anesthesia Care | Site: Hip | Laterality: Right

## 2014-06-11 MED ORDER — GLIPIZIDE 10 MG PO TABS
10.0000 mg | ORAL_TABLET | Freq: Two times a day (BID) | ORAL | Status: DC
  Administered 2014-06-11 – 2014-06-14 (×6): 10 mg via ORAL
  Filled 2014-06-11 (×9): qty 1

## 2014-06-11 MED ORDER — CEFAZOLIN SODIUM-DEXTROSE 2-3 GM-% IV SOLR
INTRAVENOUS | Status: AC
  Filled 2014-06-11: qty 50

## 2014-06-11 MED ORDER — FENTANYL CITRATE (PF) 100 MCG/2ML IJ SOLN
INTRAMUSCULAR | Status: AC
  Filled 2014-06-11: qty 2

## 2014-06-11 MED ORDER — ONDANSETRON HCL 4 MG/2ML IJ SOLN: 4.0000 mg | Freq: Four times a day (QID) | INTRAMUSCULAR | Status: DC | PRN

## 2014-06-11 MED ORDER — PROPOFOL INFUSION 10 MG/ML OPTIME
INTRAVENOUS | Status: DC | PRN
  Administered 2014-06-11: 100 ug/kg/min via INTRAVENOUS

## 2014-06-11 MED ORDER — ONDANSETRON HCL 4 MG PO TABS: 4.0000 mg | ORAL_TABLET | Freq: Four times a day (QID) | ORAL | Status: DC | PRN

## 2014-06-11 MED ORDER — PROPOFOL 10 MG/ML IV BOLUS
INTRAVENOUS | Status: AC
  Filled 2014-06-11: qty 20

## 2014-06-11 MED ORDER — METOCLOPRAMIDE HCL 5 MG/ML IJ SOLN: 5.0000 mg | Freq: Three times a day (TID) | INTRAMUSCULAR | Status: DC | PRN

## 2014-06-11 MED ORDER — LACTATED RINGERS IV SOLN
INTRAVENOUS | Status: DC | PRN
  Administered 2014-06-11 (×2): via INTRAVENOUS

## 2014-06-11 MED ORDER — SODIUM CHLORIDE 0.9 % IJ SOLN
INTRAMUSCULAR | Status: AC
  Filled 2014-06-11: qty 10

## 2014-06-11 MED ORDER — EPHEDRINE SULFATE 50 MG/ML IJ SOLN
INTRAMUSCULAR | Status: AC
  Filled 2014-06-11: qty 1

## 2014-06-11 MED ORDER — INSULIN ASPART 100 UNIT/ML ~~LOC~~ SOLN: 0.0000 [IU] | Freq: Every day | SUBCUTANEOUS | Status: DC

## 2014-06-11 MED ORDER — ASPIRIN EC 325 MG PO TBEC
325.0000 mg | DELAYED_RELEASE_TABLET | Freq: Two times a day (BID) | ORAL | Status: DC
  Administered 2014-06-11 – 2014-06-14 (×6): 325 mg via ORAL
  Filled 2014-06-11 (×8): qty 1

## 2014-06-11 MED ORDER — METFORMIN HCL 500 MG PO TABS
500.0000 mg | ORAL_TABLET | Freq: Two times a day (BID) | ORAL | Status: DC
  Administered 2014-06-11 – 2014-06-14 (×6): 500 mg via ORAL
  Filled 2014-06-11 (×9): qty 1

## 2014-06-11 MED ORDER — AMITRIPTYLINE HCL 25 MG PO TABS
25.0000 mg | ORAL_TABLET | Freq: Every day | ORAL | Status: DC
  Administered 2014-06-11 – 2014-06-13 (×3): 25 mg via ORAL
  Filled 2014-06-11 (×4): qty 1

## 2014-06-11 MED ORDER — FENTANYL CITRATE (PF) 100 MCG/2ML IJ SOLN
25.0000 ug | INTRAMUSCULAR | Status: DC | PRN
  Administered 2014-06-11: 50 ug via INTRAVENOUS

## 2014-06-11 MED ORDER — LIDOCAINE HCL (CARDIAC) 20 MG/ML IV SOLN
INTRAVENOUS | Status: AC
  Filled 2014-06-11: qty 5

## 2014-06-11 MED ORDER — MENTHOL 3 MG MT LOZG: 1.0000 | LOZENGE | OROMUCOSAL | Status: DC | PRN

## 2014-06-11 MED ORDER — ACETAMINOPHEN 650 MG RE SUPP: 650.0000 mg | Freq: Four times a day (QID) | RECTAL | Status: DC | PRN

## 2014-06-11 MED ORDER — ATENOLOL 25 MG PO TABS
25.0000 mg | ORAL_TABLET | Freq: Every morning | ORAL | Status: DC
  Administered 2014-06-12 – 2014-06-14 (×2): 25 mg via ORAL
  Filled 2014-06-11 (×3): qty 1

## 2014-06-11 MED ORDER — PHENOL 1.4 % MT LIQD: 1.0000 | OROMUCOSAL | Status: DC | PRN

## 2014-06-11 MED ORDER — ACETAMINOPHEN 325 MG PO TABS
650.0000 mg | ORAL_TABLET | Freq: Four times a day (QID) | ORAL | Status: DC | PRN
  Administered 2014-06-12 – 2014-06-14 (×3): 650 mg via ORAL
  Filled 2014-06-11 (×2): qty 2

## 2014-06-11 MED ORDER — SIMVASTATIN 10 MG PO TABS
10.0000 mg | ORAL_TABLET | Freq: Every day | ORAL | Status: DC
  Administered 2014-06-11 – 2014-06-13 (×3): 10 mg via ORAL
  Filled 2014-06-11 (×4): qty 1

## 2014-06-11 MED ORDER — MIDAZOLAM HCL 2 MG/2ML IJ SOLN
INTRAMUSCULAR | Status: AC
  Filled 2014-06-11: qty 2

## 2014-06-11 MED ORDER — CEFAZOLIN SODIUM 1-5 GM-% IV SOLN
1.0000 g | Freq: Four times a day (QID) | INTRAVENOUS | Status: AC
  Administered 2014-06-11 (×2): 1 g via INTRAVENOUS
  Filled 2014-06-11 (×2): qty 50

## 2014-06-11 MED ORDER — OXYCODONE HCL 5 MG PO TABS
5.0000 mg | ORAL_TABLET | ORAL | Status: DC | PRN
  Administered 2014-06-11 (×3): 15 mg via ORAL
  Administered 2014-06-11: 5 mg via ORAL
  Administered 2014-06-12 – 2014-06-13 (×7): 15 mg via ORAL
  Administered 2014-06-13: 10 mg via ORAL
  Administered 2014-06-13 – 2014-06-14 (×3): 15 mg via ORAL
  Filled 2014-06-11 (×6): qty 3
  Filled 2014-06-11: qty 1
  Filled 2014-06-11 (×8): qty 3

## 2014-06-11 MED ORDER — TRANEXAMIC ACID 1000 MG/10ML IV SOLN
1000.0000 mg | INTRAVENOUS | Status: AC
  Administered 2014-06-11: 1000 mg via INTRAVENOUS
  Filled 2014-06-11: qty 10

## 2014-06-11 MED ORDER — DIPHENHYDRAMINE HCL 12.5 MG/5ML PO ELIX
12.5000 mg | ORAL_SOLUTION | ORAL | Status: DC | PRN
  Administered 2014-06-13 – 2014-06-14 (×2): 25 mg via ORAL
  Filled 2014-06-11 (×2): qty 10

## 2014-06-11 MED ORDER — METHOCARBAMOL 500 MG PO TABS
500.0000 mg | ORAL_TABLET | Freq: Four times a day (QID) | ORAL | Status: DC | PRN
  Administered 2014-06-11 – 2014-06-14 (×9): 500 mg via ORAL
  Filled 2014-06-11 (×9): qty 1

## 2014-06-11 MED ORDER — SODIUM CHLORIDE 0.9 % IV SOLN
INTRAVENOUS | Status: DC
  Administered 2014-06-11 – 2014-06-12 (×2): via INTRAVENOUS

## 2014-06-11 MED ORDER — BUPIVACAINE HCL (PF) 0.5 % IJ SOLN
INTRAMUSCULAR | Status: AC
  Filled 2014-06-11: qty 30

## 2014-06-11 MED ORDER — INSULIN ASPART 100 UNIT/ML ~~LOC~~ SOLN
0.0000 [IU] | Freq: Three times a day (TID) | SUBCUTANEOUS | Status: DC
  Administered 2014-06-11 – 2014-06-12 (×2): 3 [IU] via SUBCUTANEOUS
  Administered 2014-06-12: 2 [IU] via SUBCUTANEOUS

## 2014-06-11 MED ORDER — ZOLPIDEM TARTRATE 5 MG PO TABS: 5.0000 mg | ORAL_TABLET | Freq: Every evening | ORAL | Status: DC | PRN

## 2014-06-11 MED ORDER — HYDROMORPHONE HCL 1 MG/ML IJ SOLN
1.0000 mg | INTRAMUSCULAR | Status: DC | PRN
  Administered 2014-06-11 (×4): 1 mg via INTRAVENOUS
  Filled 2014-06-11 (×4): qty 1

## 2014-06-11 MED ORDER — PHENYLEPHRINE HCL 10 MG/ML IJ SOLN
10.0000 mg | INTRAMUSCULAR | Status: DC | PRN
  Administered 2014-06-11: 10 ug/min via INTRAVENOUS

## 2014-06-11 MED ORDER — METOCLOPRAMIDE HCL 5 MG PO TABS
5.0000 mg | ORAL_TABLET | Freq: Three times a day (TID) | ORAL | Status: DC | PRN
  Filled 2014-06-11: qty 2

## 2014-06-11 MED ORDER — PROPOFOL 10 MG/ML IV BOLUS
INTRAVENOUS | Status: AC
Start: 2014-06-11 — End: 2014-06-11
  Filled 2014-06-11: qty 20

## 2014-06-11 MED ORDER — PHENYLEPHRINE HCL 10 MG/ML IJ SOLN
INTRAMUSCULAR | Status: AC
  Filled 2014-06-11: qty 1

## 2014-06-11 MED ORDER — SODIUM CHLORIDE 0.9 % IR SOLN
Status: DC | PRN
  Administered 2014-06-11: 1000 mL

## 2014-06-11 MED ORDER — MIDAZOLAM HCL 5 MG/5ML IJ SOLN
INTRAMUSCULAR | Status: DC | PRN
  Administered 2014-06-11: 2 mg via INTRAVENOUS

## 2014-06-11 MED ORDER — ALUM & MAG HYDROXIDE-SIMETH 200-200-20 MG/5ML PO SUSP: 30.0000 mL | ORAL | Status: DC | PRN

## 2014-06-11 MED ORDER — ONDANSETRON HCL 4 MG/2ML IJ SOLN
INTRAMUSCULAR | Status: AC
  Filled 2014-06-11: qty 2

## 2014-06-11 MED ORDER — PROMETHAZINE HCL 25 MG/ML IJ SOLN: 6.2500 mg | INTRAMUSCULAR | Status: DC | PRN

## 2014-06-11 MED ORDER — CEFAZOLIN SODIUM-DEXTROSE 2-3 GM-% IV SOLR
2.0000 g | INTRAVENOUS | Status: AC
  Administered 2014-06-11: 2 g via INTRAVENOUS

## 2014-06-11 MED ORDER — PHENYLEPHRINE HCL 10 MG/ML IJ SOLN
INTRAMUSCULAR | Status: DC | PRN
  Administered 2014-06-11 (×3): 40 ug via INTRAVENOUS

## 2014-06-11 MED ORDER — METHOCARBAMOL 1000 MG/10ML IJ SOLN
500.0000 mg | Freq: Four times a day (QID) | INTRAVENOUS | Status: DC | PRN
  Administered 2014-06-11: 500 mg via INTRAVENOUS
  Filled 2014-06-11 (×2): qty 5

## 2014-06-11 MED ORDER — DOCUSATE SODIUM 100 MG PO CAPS
100.0000 mg | ORAL_CAPSULE | Freq: Two times a day (BID) | ORAL | Status: DC
  Administered 2014-06-11 – 2014-06-14 (×6): 100 mg via ORAL
  Filled 2014-06-11 (×6): qty 1

## 2014-06-11 MED ORDER — EPHEDRINE SULFATE 50 MG/ML IJ SOLN
INTRAMUSCULAR | Status: DC | PRN
  Administered 2014-06-11: 10 mg via INTRAVENOUS
  Administered 2014-06-11: 5 mg via INTRAVENOUS

## 2014-06-11 MED ORDER — BUPIVACAINE HCL (PF) 0.5 % IJ SOLN
INTRAMUSCULAR | Status: DC | PRN
  Administered 2014-06-11: 3 mL

## 2014-06-11 SURGICAL SUPPLY — 42 items
BAG ZIPLOCK 12X15 (MISCELLANEOUS) IMPLANT
BENZOIN TINCTURE PRP APPL 2/3 (GAUZE/BANDAGES/DRESSINGS) ×3 IMPLANT
BLADE SAW SGTL 18X1.27X75 (BLADE) ×2 IMPLANT
BLADE SAW SGTL 18X1.27X75MM (BLADE) ×1
CAPT HIP TOTAL 2 ×3 IMPLANT
CELLS DAT CNTRL 66122 CELL SVR (MISCELLANEOUS) ×1 IMPLANT
CLOSURE WOUND 1/2 X4 (GAUZE/BANDAGES/DRESSINGS) ×1
COVER PERINEAL POST (MISCELLANEOUS) ×3 IMPLANT
DRAPE C-ARM 42X120 X-RAY (DRAPES) ×3 IMPLANT
DRAPE INCISE 23X17 IOBAN STRL (DRAPES) ×2
DRAPE INCISE IOBAN 23X17 STRL (DRAPES) ×1 IMPLANT
DRAPE STERI IOBAN 125X83 (DRAPES) IMPLANT
DRAPE U-SHAPE 47X51 STRL (DRAPES) ×9 IMPLANT
DRSG AQUACEL AG ADV 3.5X10 (GAUZE/BANDAGES/DRESSINGS) ×3 IMPLANT
ELECT BLADE TIP CTD 4 INCH (ELECTRODE) ×3 IMPLANT
ELECT REM PT RETURN 9FT ADLT (ELECTROSURGICAL) ×3
ELECTRODE REM PT RTRN 9FT ADLT (ELECTROSURGICAL) ×1 IMPLANT
FACESHIELD WRAPAROUND (MASK) ×9 IMPLANT
GAUZE XEROFORM 1X8 LF (GAUZE/BANDAGES/DRESSINGS) IMPLANT
GLOVE BIO SURGEON STRL SZ7.5 (GLOVE) ×3 IMPLANT
GLOVE BIOGEL PI IND STRL 8 (GLOVE) ×2 IMPLANT
GLOVE BIOGEL PI INDICATOR 8 (GLOVE) ×4
GLOVE ECLIPSE 8.0 STRL XLNG CF (GLOVE) ×3 IMPLANT
GOWN STRL REUS W/TWL XL LVL3 (GOWN DISPOSABLE) ×6 IMPLANT
HANDPIECE INTERPULSE COAX TIP (DISPOSABLE) ×2
KIT BASIN OR (CUSTOM PROCEDURE TRAY) ×3 IMPLANT
PACK TOTAL JOINT (CUSTOM PROCEDURE TRAY) ×3 IMPLANT
PEN SKIN MARKING BROAD (MISCELLANEOUS) ×3 IMPLANT
RTRCTR WOUND ALEXIS 18CM MED (MISCELLANEOUS) ×3
SET HNDPC FAN SPRY TIP SCT (DISPOSABLE) ×1 IMPLANT
STAPLER VISISTAT 35W (STAPLE) IMPLANT
STRIP CLOSURE SKIN 1/2X4 (GAUZE/BANDAGES/DRESSINGS) ×2 IMPLANT
SUT ETHIBOND NAB CT1 #1 30IN (SUTURE) ×3 IMPLANT
SUT MNCRL AB 4-0 PS2 18 (SUTURE) IMPLANT
SUT VIC AB 0 CT1 36 (SUTURE) ×3 IMPLANT
SUT VIC AB 1 CT1 36 (SUTURE) ×3 IMPLANT
SUT VIC AB 2-0 CT1 27 (SUTURE) ×4
SUT VIC AB 2-0 CT1 TAPERPNT 27 (SUTURE) ×2 IMPLANT
TOWEL OR 17X26 10 PK STRL BLUE (TOWEL DISPOSABLE) ×3 IMPLANT
TOWEL OR NON WOVEN STRL DISP B (DISPOSABLE) ×3 IMPLANT
TRAY FOLEY CATH 16FR SILVER (SET/KITS/TRAYS/PACK) ×3 IMPLANT
YANKAUER SUCT BULB TIP 10FT TU (MISCELLANEOUS) ×3 IMPLANT

## 2014-06-11 NOTE — Progress Notes (Signed)
PAT-  Received voicemail message  Left 06/10/14 1720  from [facility]  That eccho was done 06/09/14 and that results will not be available for two weeks until cardio reads test

## 2014-06-11 NOTE — Anesthesia Preprocedure Evaluation (Addendum)
Anesthesia Evaluation  Patient identified by MRN, date of birth, ID band Patient awake    Reviewed: Allergy & Precautions, NPO status , Patient's Chart, lab work & pertinent test results, reviewed documented beta blocker date and time   Airway Mallampati: III  TM Distance: >3 FB Neck ROM: Full    Dental   Pulmonary sleep apnea ,  breath sounds clear to auscultation        Cardiovascular hypertension, Pt. on medications and Pt. on home beta blockers Rhythm:Regular Rate:Normal     Neuro/Psych negative neurological ROS     GI/Hepatic negative GI ROS, Neg liver ROS,   Endo/Other  diabetes, Type 2  Renal/GU negative Renal ROS     Musculoskeletal  (+) Arthritis -,   Abdominal   Peds  Hematology negative hematology ROS (+)   Anesthesia Other Findings   Reproductive/Obstetrics                           Anesthesia Physical Anesthesia Plan  ASA: III  Anesthesia Plan: Spinal and MAC   Post-op Pain Management:    Induction: Intravenous  Airway Management Planned: Natural Airway and Simple Face Mask  Additional Equipment:   Intra-op Plan:   Post-operative Plan:   Informed Consent: I have reviewed the patients History and Physical, chart, labs and discussed the procedure including the risks, benefits and alternatives for the proposed anesthesia with the patient or authorized representative who has indicated his/her understanding and acceptance.   Dental advisory given  Plan Discussed with: CRNA  Anesthesia Plan Comments:        Anesthesia Quick Evaluation

## 2014-06-11 NOTE — Progress Notes (Signed)
Rt set up cpap and placed on pt. Pt was not comfortable with cpap and stated he only wanted to use oxygen. Rt placed pt on 3lpm [place] and encouraged pt to call if he wanted to try cpap again.

## 2014-06-11 NOTE — Evaluation (Signed)
Physical Therapy Evaluation Patient Details Name: Eric Wood MRN: [id number] DOB: [birth year] Today's Date: 06/11/2014   History of Present Illness  R THR  Clinical Impression  Pt s/p R THR presents with decreased R LE strength/ROM and post op pain limiting functional mobility.  Pt should progress to dc home with family assist and HHPT follow up.    Follow Up Recommendations Home health PT    Equipment Recommendations  Rolling walker with 5" wheels    Recommendations for Other Services OT consult     Precautions / Restrictions Precautions Precautions: Fall Restrictions Weight Bearing Restrictions: No Other Position/Activity Restrictions: WBAT      Mobility  Bed Mobility Overal bed mobility: Needs Assistance Bed Mobility: Supine to Sit;Sit to Supine     Supine to sit: Min assist;Mod assist Sit to supine: Min assist;Mod assist   General bed mobility comments: cues for sequence and use of L LE to self assist  Transfers Overall transfer level: Needs assistance Equipment used: Rolling walker (2 wheeled) Transfers: Sit to/from Stand Sit to Stand: Mod assist         General transfer comment: cues for LE management and use of UEs to self assist  Ambulation/Gait Ambulation/Gait assistance: Min assist Ambulation Distance (Feet): 18 Feet Assistive device: Rolling walker (2 wheeled);Straight cane Gait Pattern/deviations: Step-to pattern;Decreased step length - right;Decreased step length - left;Shuffle;Trunk flexed Gait velocity: decr   General Gait Details: cues for sequence, posture and position from [employer]            Wheelchair Mobility    Modified Rankin (Stroke Patients Only)       Balance                                             Pertinent Vitals/Pain Pain Assessment: 0-10 Pain Score: 4  Pain Location: R hip Pain Descriptors / Indicators: Aching;Sore Pain Intervention(s): Limited activity within patient's  tolerance;Monitored during session;Premedicated before session;Ice applied    Home Living Family/patient expects to be discharged to:: Private residence Living Arrangements: Other relatives Available Help at Discharge: Family Type of Home: House Home Access: Stairs to enter Entrance Stairs-Rails: Right Entrance Stairs-Number of Steps: 4 Home Layout: One level Home Equipment: Walker - 4 wheels      Prior Function Level of Independence: Independent;Independent with assistive device(s)               Hand Dominance        Extremity/Trunk Assessment   Upper Extremity Assessment: Overall WFL for tasks assessed           Lower Extremity Assessment: RLE deficits/detail      Cervical / Trunk Assessment: Normal  Communication   Communication: Expressive difficulties  Cognition Arousal/Alertness: Awake/alert Behavior During Therapy: WFL for tasks assessed/performed Overall Cognitive Status: Within Functional Limits for tasks assessed                      General Comments      Exercises        Assessment/Plan    PT Assessment Patient needs continued PT services  PT Diagnosis Difficulty walking   PT Problem List Decreased strength;Decreased range of motion;Decreased activity tolerance;Decreased mobility;Decreased knowledge of use of DME;Pain  PT Treatment Interventions DME instruction;Gait training;Stair training;Functional mobility training;Therapeutic activities;Therapeutic exercise;Patient/family education   PT Goals (Current goals can be found in  the Care Plan section) Acute Rehab PT Goals Patient Stated Goal: Walk without pain PT Goal Formulation: With patient Time For Goal Achievement: 06/15/14 Potential to Achieve Goals: Good    Frequency 7X/week   Barriers to discharge        Co-evaluation               End of Session Equipment Utilized During Treatment: Gait belt Activity Tolerance: Patient tolerated treatment well Patient  left: in bed;with call bell/phone within reach Nurse Communication: Mobility status         Time: 1959-7471 PT Time Calculation (min) (ACUTE ONLY): 23 min   Charges:   PT Evaluation $Initial PT Evaluation Tier I: 1 Procedure PT Treatments $Gait Training: 8-22 mins   PT G Codes:        Skylar Priest 2014-06-16, 5:49 PM

## 2014-06-11 NOTE — Op Note (Signed)
NAMEOVILA, LEPAGE NO.:  [id number]  MEDICAL RECORD NO.:  [id number]  LOCATION:  2800                         FACILITY:  [facility]  PHYSICIAN:  Lind Guest. Ninfa Linden, M.D.DATE OF BIRTH:  [birth year]  DATE OF PROCEDURE:  06/11/2014 DATE OF DISCHARGE:                              OPERATIVE REPORT   PREOPERATIVE DIAGNOSIS:  Primary osteoarthritis with osteonecrosis, right hip.  POSTOPERATIVE DIAGNOSIS:  Primary osteoarthritis with osteonecrosis, right hip.  PROCEDURE:  Right total hip arthroplasty through direct anterior approach.  IMPLANTS:  DePuy Sector Gription acetabular component size 54 with a single screw in an apex hole eliminator, size 36 +4 neutral polyethylene liner, size 11 Corail femoral component with varus offset (KLA), size 36 +5 ceramic hip ball.  SURGEON:  Lind Guest. Ninfa Linden, M.D.  ASSISTANT:  Erskine Emery, P.A.  ANESTHESIA:  Spinal.  ANTIBIOTICS:  Ancef 2 g IV.  ESTIMATED BLOOD LOSS:  300 mL.  COMPLICATIONS:  None.  INDICATIONS:  Eric Wood is a 59 year old gentleman, well known to me.  He has debilitating pain involving his right hip.  He says his left hip does not hurt, but x-rays of both hips show severe collapse of the femoral heads, no joint space remaining, and evidence of both osteoarthritis and osteonecrosis.  He has tried every form of conservative treatment.  His pain is daily, his mobility is limited, and greatly affected his quality of life.  He is at the point where he does wish to proceed with a total hip arthroplasty and this has been recommended.  He understands the risks of acute blood loss anemia, nerve and vessel injury, fracture, infection, dislocation, DVT.  He understands the goals are decreased pain, improved mobility, and overall improved quality of life.  DESCRIPTION OF PROCEDURE:  After informed consent was obtained, appropriate right hip was marked.  He was brought to the operating room and  spinal anesthesia was obtained while he was on the stretcher.  He was laid in supine position.  A Foley catheter was placed and then both feet had traction boots applied to them.  He was placed supine on the Hana fracture table with the perineal post in place and both legs in inline skeletal traction devices, but no traction applied.  His right operative hip was prepped and draped with ChloraPrep and sterile drapes. A time-out was called and he was identified as correct patient, correct right hip.  We then made an incision inferior and posterior to the anterior superior iliac spine and carried this obliquely down the leg. We dissected down to the tensor fascia lata muscle.  Tensor fascia was then divided longitudinally, so we could proceed with a direct anterior approach to the hip.  We identified and cauterized the lateral femoral circumflex vessels and then identified the hip capsule.  We placed Cobra retractors around the lateral neck and up underneath the rectus femoris around the medial femoral neck.  We then opened up the hip capsule in an L-type format, placing the Cobra retractors in the hip capsule.  We then made our neck cut just proximal to the lesser trochanter with an oscillating saw and completed this with an osteotome, is incredibly  tight, and we were able to remove the femoral head after placing a corkscrew guide and this has been removed in its entirety and found it to be devoid of cartilage completely with collapse.  We then passed it off the table and cleaned the acetabular remnants with acetabular labrum and debris and periarticular osteophytes.  We placed a bent Hohmann over the medial acetabular rim and then visualized our reaming, starting with a 42 reamer and progressing up to a size 54, all reamers were placed under direct visualization and the last reamer under direct fluoroscopy, so we could obtain our depth of reaming, our inclination, and anteversion.  Once I  was pleased with this, I placed the real DePuy Sector Gription acetabular component size 54, apex hole eliminator and a single screw.  I then placed the real 36 +4 neutral polyethylene liner for that size acetabular component.  Attention was then turned to the femur.  With the leg externally rotated to 100 degrees, extended and adducted, we were able to place a Mueller retractor medially and a Hohmann retractor behind the greater trochanter.  I released as much as I could the lateral joint capsule and this tissue was incredibly tight. I used a rongeur to lateralize and a box cutting osteotome to enter the femoral canal.  I then began broaching using the Corail broaching system from a size 8 up to a size 11.  With the size 11, we trialed a varus offset femoral neck based on his offset and due to the fact we had the femoral neck cut pretty low, I trialed a 36 +5 ceramic hip ball.  We reduced this in the acetabulum.  I was pleased with his range of motion, stability, his offset and leg lengths.  We then dislocated the hip and removed the trial components.  We then placed the real Corail femoral component with varus offset size 11 followed by the real 36 +5 ceramic hip ball, we reduced this in the acetabulum.  Again, I was pleased with stability.  We copiously irrigated the soft tissues with normal saline solution using pulsatile lavage.  We closed the joint capsule with interrupted #1 Ethibond suture followed by a running #1 Vicryl in the tensor fascia, 0 Vicryl in the deep tissue, 2-0 Vicryl in the subcutaneous tissue, 4-0 Monocryl subcuticular stitch and Steri-Strips on the skin, and an Aquacel dressing were applied.  He was then taken off the Hana table and taken to the recovery room in stable condition. All final counts were correct.  There were no complications noted.  Of note, Erskine Emery, PA-C, assisted in entire case and his assistance was crucial for facilitating all aspects of  this case.     Lind Guest. Ninfa Linden, M.D.     CYB/MEDQ  D:  06/11/2014  T:  06/11/2014  Job:  [id number]

## 2014-06-11 NOTE — Anesthesia Postprocedure Evaluation (Signed)
  Anesthesia Post-op Note  Patient: Eric Wood  Procedure(s) Performed: Procedure(s): RIGHT TOTAL HIP ARTHROPLASTY ANTERIOR APPROACH (Right)  Patient Location: PACU  Anesthesia Type:Spinal  Level of Consciousness: awake and alert   Airway and Oxygen Therapy: Patient Spontanous Breathing  Post-op Pain: mild  Post-op Assessment: Post-op Vital signs reviewed   LLE Sensation: Numbness, Tingling   RLE Sensation: Numbness, Tingling L Sensory Level: S1-Sole of foot, small toes R Sensory Level: S1-Sole of foot, small toes  Post-op Vital Signs: Reviewed  Last Vitals:  Filed Vitals:   06/11/14 1112  BP: 141/86  Pulse: 63  Temp: 36.6 C  Resp: 16    Complications: No apparent anesthesia complications

## 2014-06-11 NOTE — Transfer of Care (Signed)
Immediate Anesthesia Transfer of Care Note  Patient: Eric Wood  Procedure(s) Performed: Procedure(s): RIGHT TOTAL HIP ARTHROPLASTY ANTERIOR APPROACH (Right)  Patient Location: PACU  Anesthesia Type:MAC and Spinal  Level of Consciousness: awake, alert  and oriented  Airway & Oxygen Therapy: Patient Spontanous Breathing and Patient connected to face mask oxygen  Post-op Assessment: Report given to RN  Post vital signs: Reviewed and stable  Last Vitals:  Filed Vitals:   06/11/14 0533  BP: 156/84  Pulse: 62  Temp: 36.6 C  Resp: 18    Complications: No apparent anesthesia complications

## 2014-06-11 NOTE — Anesthesia Procedure Notes (Signed)
Spinal Patient location during procedure: OR Start time: 06/11/2014 7:29 AM End time: 06/11/2014 7:33 AM Staffing Resident/CRNA: Kaedynce Tapp G Performed by: resident/CRNA  Preanesthetic Checklist Completed: patient identified, site marked, surgical consent, pre-op evaluation, timeout performed, IV checked, risks and benefits discussed and monitors and equipment checked Spinal Block Patient position: sitting Prep: ChloraPrep Patient monitoring: heart rate, continuous pulse ox and blood pressure Approach: midline Location: L2-3 Injection technique: single-shot Needle Needle type: Sprotte  Needle gauge: 24 G Needle length: 10 cm Needle insertion depth: 5 cm Assessment Sensory level: T6 Additional Notes Sterile prep and drape with chloraprep.  Single stick with 24g sprotte, neg. Heme, neg. Paraesthesia, pos. CSF.  Spinal tray lot #[id number] exp. 10-2015.

## 2014-06-11 NOTE — Progress Notes (Signed)
MD called about patient threatening to remove foley catheter. MD stated that Rn could remove foley catheter. Foley catheter removed at 1230.   Patient also restless in the bed, and moving self around vigorously. Patient will not listen to staff about positioning or calming techniques. RN has given pain medication that is ordered.   RN will inform MD of patient status as needed.

## 2014-06-11 NOTE — Brief Op Note (Signed)
06/11/2014  8:59 AM  PATIENT:  Eric Wood  59 y.o. male  PRE-OPERATIVE DIAGNOSIS:  severe osteoarthritis right hip  POST-OPERATIVE DIAGNOSIS:  severe osteoarthritis right hip  PROCEDURE:  Procedure(s): RIGHT TOTAL HIP ARTHROPLASTY ANTERIOR APPROACH (Right)  SURGEON:  Surgeon(s) and Role:    * Mcarthur Rossetti, MD - Primary  PHYSICIAN ASSISTANT: Benita Stabile, PA-C  ANESTHESIA:   spinal  EBL:  Total I/O In: 1000 [I.V.:1000] Out: 500 [Urine:200; Blood:300]  BLOOD ADMINISTERED:none  DRAINS: none   LOCAL MEDICATIONS USED:  NONE  SPECIMEN:  No Specimen  DISPOSITION OF SPECIMEN:  N/A  COUNTS:  YES  TOURNIQUET:  * No tourniquets in log *  DICTATION: .Other Dictation: Dictation Number [id number]  PLAN OF CARE: Admit to inpatient   PATIENT DISPOSITION:  PACU - hemodynamically stable.   Delay start of Pharmacological VTE agent (>24hrs) due to surgical blood loss or risk of bleeding: no

## 2014-06-11 NOTE — H&P (Signed)
TOTAL HIP ADMISSION H&P  Patient is admitted for right total hip arthroplasty.  Subjective:  Chief Complaint: right hip pain  HPI: Eric Wood, 59 y.o. male, has a history of pain and functional disability in the right hip(s) due to arthritis and patient has failed non-surgical conservative treatments for greater than 12 weeks to include NSAID's and/or analgesics, corticosteriod injections, flexibility and strengthening excercises, use of assistive devices and activity modification.  Onset of symptoms was gradual starting 5 years ago with gradually worsening course since that time.The patient noted no past surgery on the right hip(s).  Patient currently rates pain in the right hip at 10 out of 10 with activity. Patient has night pain, worsening of pain with activity and weight bearing, trendelenberg gait, pain that interfers with activities of daily living, pain with passive range of motion, crepitus and joint swelling. Patient has evidence of subchondral cysts, subchondral sclerosis, periarticular osteophytes, joint space narrowing and femoral head collapse by imaging studies. This condition presents safety issues increasing the risk of falls.  There is no current active infection.  Patient Active Problem List   Diagnosis Date Noted  . Osteoarthritis of right hip 06/11/2014   Past Medical History  Diagnosis Date  . Hypertension   . Hypercholesteremia     under control  . Diabetes mellitus     type 2  . Arthritis   . Back pain     acupuncture  . Family history of adverse reaction to anesthesia     Severe PONV    Past Surgical History  Procedure Laterality Date  . No past surgeries    . Colonoscopy w/ polypectomy    . Acupuncture      For back and hip    Prescriptions prior to admission  Medication Sig Dispense Refill Last Dose  . acetaminophen-codeine (TYLENOL #3) 300-30 MG per tablet Take 1-2 tablets by mouth every 8 (eight) hours as needed for moderate pain.    06/10/2014 at 0200   . amitriptyline (ELAVIL) 25 MG tablet Take 25 mg by mouth at bedtime.   Past Week at Unknown time  . atenolol (TENORMIN) 25 MG tablet Take 25 mg by mouth every morning.    06/11/2014 at 0420  . glipiZIDE (GLUCOTROL) 10 MG tablet Take 10 mg by mouth 2 (two) times daily before a meal.   06/11/2014 at 0420  . HYDROcodone-acetaminophen (NORCO/VICODIN) 5-325 MG per tablet Take 1 tablet by mouth every 8 (eight) hours as needed for moderate pain.   Past Month at Unknown time  . lisinopril (PRINIVIL,ZESTRIL) 10 MG tablet Take 20 mg by mouth every morning.    06/11/2014 at 0420  . metFORMIN (GLUCOPHAGE) 500 MG tablet Take 500 mg by mouth 2 (two) times daily with a meal.   06/11/2014 at 0420  . PRESCRIPTION MEDICATION Apply 1 application topically 2 (two) times daily as needed (dry skin). Lotion he receives from the [place]   06/10/2014 at Unknown time  . simvastatin (ZOCOR) 10 MG tablet Take 10 mg by mouth at bedtime.   06/10/2014 at pm  . traMADol (ULTRAM) 50 MG tablet Take 50 mg by mouth 2 (two) times daily as needed for moderate pain.   06/11/2014 at 0200  . UNABLE TO FIND C Pap      No Known Allergies  History  Substance Use Topics  . Smoking status: Never Smoker   . Smokeless tobacco: Never Used  . Alcohol Use: Yes     Comment: occasional    History  reviewed. No pertinent family history.   Review of Systems  Musculoskeletal: Positive for joint pain.  All other systems reviewed and are negative.   Objective:  Physical Exam  Constitutional: He is oriented to person, place, and time. He appears well-developed and well-nourished.  HENT:  Head: Normocephalic and atraumatic.  Eyes: EOM are normal. Pupils are equal, round, and reactive to light.  Neck: Normal range of motion. Neck supple.  Cardiovascular: Normal rate and regular rhythm.   Respiratory: Effort normal and breath sounds normal.  GI: Soft. Bowel sounds are normal.  Musculoskeletal:       Right hip: He exhibits decreased range of motion,  decreased strength, tenderness, bony tenderness and crepitus.  Neurological: He is alert and oriented to person, place, and time.  Skin: Skin is warm and dry.  Psychiatric: He has a normal mood and affect.    Vital signs in last 24 hours: Temp:  [97.8 F (36.6 C)] 97.8 F (36.6 C) (06/10 0533) Pulse Rate:  [62] 62 (06/10 0533) Resp:  [18] 18 (06/10 0533) BP: (156)/(84) 156/84 mmHg (06/10 0533) SpO2:  [100 %] 100 % (06/10 0533) Weight:  [93.441 kg (206 lb)] 93.441 kg (206 lb) (06/10 0610)  Labs:   Estimated body mass index is 29.56 kg/(m^2) as calculated from the following:   Height as of this encounter: 5\' 10"  (1.778 m).   Weight as of this encounter: 93.441 kg (206 lb).   Imaging Review Plain radiographs demonstrate severe degenerative joint disease of the right hip(s). The bone quality appears to be good for age and reported activity level.  Assessment/Plan:  End stage arthritis, right hip(s)  The patient history, physical examination, clinical judgement of the provider and imaging studies are consistent with end stage degenerative joint disease of the right hip(s) and total hip arthroplasty is deemed medically necessary. The treatment options including medical management, injection therapy, arthroscopy and arthroplasty were discussed at length. The risks and benefits of total hip arthroplasty were presented and reviewed. The risks due to aseptic loosening, infection, stiffness, dislocation/subluxation,  thromboembolic complications and other imponderables were discussed.  The patient acknowledged the explanation, agreed to proceed with the plan and consent was signed. Patient is being admitted for inpatient treatment for surgery, pain control, PT, OT, prophylactic antibiotics, VTE prophylaxis, progressive ambulation and ADL's and discharge planning.The patient is planning to be discharged home with home health services

## 2014-06-12 LAB — CBC
HEMATOCRIT: 31.5 % — AB (ref 39.0–52.0)
HEMOGLOBIN: 10.7 g/dL — AB (ref 13.0–17.0)
MCH: 31.2 pg (ref 26.0–34.0)
MCHC: 34 g/dL (ref 30.0–36.0)
MCV: 91.8 fL (ref 78.0–100.0)
Platelets: 302 10*3/uL (ref 150–400)
RBC: 3.43 MIL/uL — ABNORMAL LOW (ref 4.22–5.81)
RDW: 13.3 % (ref 11.5–15.5)
WBC: 6.6 10*3/uL (ref 4.0–10.5)

## 2014-06-12 LAB — BASIC METABOLIC PANEL
Anion gap: 7 (ref 5–15)
BUN: 10 mg/dL (ref 6–20)
CO2: 27 mmol/L (ref 22–32)
CREATININE: 0.86 mg/dL (ref 0.61–1.24)
Calcium: 8.2 mg/dL — ABNORMAL LOW (ref 8.9–10.3)
Chloride: 100 mmol/L — ABNORMAL LOW (ref 101–111)
Glucose, Bld: 136 mg/dL — ABNORMAL HIGH (ref 65–99)
Potassium: 3.6 mmol/L (ref 3.5–5.1)
Sodium: 134 mmol/L — ABNORMAL LOW (ref 135–145)

## 2014-06-12 LAB — GLUCOSE, CAPILLARY
GLUCOSE-CAPILLARY: 103 mg/dL — AB (ref 65–99)
Glucose-Capillary: 125 mg/dL — ABNORMAL HIGH (ref 65–99)
Glucose-Capillary: 112 mg/dL — ABNORMAL HIGH (ref 65–99)
Glucose-Capillary: 169 mg/dL — ABNORMAL HIGH (ref 65–99)

## 2014-06-12 MED ORDER — OXYCODONE-ACETAMINOPHEN 5-325 MG PO TABS: 1.0000 | ORAL_TABLET | ORAL | Status: DC | PRN

## 2014-06-12 MED ORDER — ASPIRIN 325 MG PO TBEC: 325.0000 mg | DELAYED_RELEASE_TABLET | Freq: Two times a day (BID) | ORAL | Status: DC

## 2014-06-12 MED ORDER — METHOCARBAMOL 500 MG PO TABS: 500.0000 mg | ORAL_TABLET | Freq: Four times a day (QID) | ORAL | Status: DC | PRN

## 2014-06-12 NOTE — Progress Notes (Signed)
Physical Therapy Treatment Patient Details Name: Eric Wood MRN: [id number] DOB: [birth year] Today's Date: 06/12/2014    History of Present Illness R THR    PT Comments    Pt progressing well with mobility considering limitations of non-operative hip.  Follow Up Recommendations  Home health PT     Equipment Recommendations  Rolling walker with 5" wheels    Recommendations for Other Services OT consult     Precautions / Restrictions Precautions Precautions: Fall Restrictions Weight Bearing Restrictions: No Other Position/Activity Restrictions: WBAT    Mobility  Bed Mobility Overal bed mobility: Needs Assistance Bed Mobility: Supine to Sit;Sit to Supine   Sidelying to sit: Min assist Supine to sit: Min assist;Mod assist Sit to supine: Min assist;Mod assist   General bed mobility comments: cues for sequence; assist with Bil LEs  Transfers Overall transfer level: Needs assistance Equipment used: Rolling walker (2 wheeled) Transfers: Sit to/from Stand Sit to Stand: Min assist;From elevated surface         General transfer comment: cues for hand placement and LE management. min assist to rise and steady.   Ambulation/Gait Ambulation/Gait assistance: Min assist Ambulation Distance (Feet): 75 Feet Assistive device: Rolling walker (2 wheeled) Gait Pattern/deviations: Step-to pattern;Decreased step length - right;Decreased step length - left;Shuffle;Narrow base of support Gait velocity: decr   General Gait Details: cues for sequence, posture and position from [employer]            Wheelchair Mobility    Modified Rankin (Stroke Patients Only)       Balance                                    Cognition Arousal/Alertness: Awake/alert Behavior During Therapy: WFL for tasks assessed/performed Overall Cognitive Status: Within Functional Limits for tasks assessed                      Exercises      General Comments         Pertinent Vitals/Pain Pain Assessment: 0-10 Pain Score: 7  Pain Location: R hip Pain Descriptors / Indicators: Aching;Sore Pain Intervention(s): Limited activity within patient's tolerance;Monitored during session;Premedicated before session;Ice applied    Home Living Family/patient expects to be discharged to:: Private residence Living Arrangements: Other relatives Available Help at Discharge: Family Type of Home: House Home Access: Stairs to enter Entrance Stairs-Rails: Right Home Layout: One level Home Equipment: Walker - 4 wheels      Prior Function Level of Independence: Independent;Independent with assistive device(s)          PT Goals (current goals can now be found in the care plan section) Acute Rehab PT Goals Patient Stated Goal: decrease pain PT Goal Formulation: With patient Time For Goal Achievement: 06/15/14 Potential to Achieve Goals: Good Progress towards PT goals: Progressing toward goals    Frequency  7X/week    PT Plan Current plan remains appropriate    Co-evaluation             End of Session Equipment Utilized During Treatment: Gait belt Activity Tolerance: Patient tolerated treatment well Patient left: in bed;with call bell/phone within reach     Time: 5361-4431 PT Time Calculation (min) (ACUTE ONLY): 25 min  Charges:  $Gait Training: 23-37 mins                    G Codes:  Eric Wood 06/12/2014, 3:30 PM

## 2014-06-12 NOTE — Evaluation (Addendum)
Occupational Therapy Evaluation Patient Details Name: Eric Wood MRN: [id number] DOB: 1955/08/04 Today's Date: 06/12/2014    History of Present Illness R THR   Clinical Impression   Pt slow with mobilizing to the bathroom to practice transfer on and off 3in1. He reports feeling stiff today and pain 7/10 with activity. Discussed and educated on DME including 3in1 and tub transfer bench. He is interested in the tub transfer bench if covered by his benefits. Will follow to progress ADL independence and safety.    Follow Up Recommendations  Home health OT;Supervision/Assistance - 24 hour    Equipment Recommendations  3 in 1 bedside comode;Tub/shower bench    Recommendations for Other Services       Precautions / Restrictions Precautions Precautions: Fall Restrictions Weight Bearing Restrictions: No Other Position/Activity Restrictions: WBAT      Mobility Bed Mobility Overal bed mobility: Needs Assistance Bed Mobility: Sidelying to Sit   Sidelying to sit: Min assist       General bed mobility comments: for R LE to initially move over toward OOB  Transfers Overall transfer level: Needs assistance Equipment used: Rolling walker (2 wheeled) Transfers: Sit to/from Stand Sit to Stand: Min assist         General transfer comment: cues for hand placement and LE management. min assist to rise and steady.     Balance                                            ADL Overall ADL's : Needs assistance/impaired Eating/Feeding: Independent;Sitting   Grooming: Wash/dry hands;Set up;Sitting   Upper Body Bathing: Set up;Sitting   Lower Body Bathing: Moderate assistance;Sit to/from stand   Upper Body Dressing : Set up;Sitting   Lower Body Dressing: Maximal assistance;Sit to/from stand   Toilet Transfer: Minimal assistance;Ambulation;BSC;RW   Toileting- Clothing Manipulation and Hygiene: Minimal assistance;Sit to/from stand         General ADL  Comments: Pt slow to perform task of transferring into bathroom to 3in1 and back out to the chair. Pt reports 7/10 pain and stiffness. Discussed option of a tubbench and if covered through [place], pt is interested in the tub transfer bench. Discussed 3in1 also and how to adjust for appropriate height. He needs min to mod cues for safety with walker use and hand placement/Le management.      Vision     Perception     Praxis      Pertinent Vitals/Pain Pain Assessment: 0-10 Pain Score: 7  Pain Location: R hip Pain Descriptors / Indicators: Aching Pain Intervention(s): Repositioned;Ice applied     Hand Dominance     Extremity/Trunk Assessment Upper Extremity Assessment Upper Extremity Assessment: Overall WFL for tasks assessed           Communication Communication Communication: Expressive difficulties   Cognition Arousal/Alertness: Awake/alert Behavior During Therapy: WFL for tasks assessed/performed Overall Cognitive Status: Within Functional Limits for tasks assessed                     General Comments       Exercises       Shoulder Instructions      Home Living Family/patient expects to be discharged to:: Private residence Living Arrangements: Other relatives Available Help at Discharge: Family Type of Home: House Home Access: Stairs to enter [employer] of Steps: 4 Entrance Stairs-Rails: Right Home  Layout: One level     Bathroom Shower/Tub: Teacher, early years/pre: Standard     Home Equipment: Walker - 4 wheels          Prior Functioning/Environment Level of Independence: Independent;Independent with assistive device(s)             OT Diagnosis: Generalized weakness   OT Problem List: Decreased strength;Decreased knowledge of use of DME or AE;Pain   OT Treatment/Interventions: Self-care/ADL training;Patient/family education;Therapeutic activities;DME and/or AE instruction    OT Goals(Current goals can be found  in the care plan section) Acute Rehab OT Goals Patient Stated Goal: decrease pain OT Goal Formulation: With patient Time For Goal Achievement: 06/19/14 Potential to Achieve Goals: Good  OT Frequency: Min 2X/week   Barriers to D/C:            Co-evaluation              End of Session Equipment Utilized During Treatment: Gait belt;Rolling walker  Activity Tolerance: Patient limited by pain Patient left: in chair;with call bell/phone within reach   Time: 1035-1105 OT Time Calculation (min): 30 min Charges:  OT General Charges $OT Visit: 1 Procedure OT Evaluation $Initial OT Evaluation Tier I: 1 Procedure OT Treatments $Therapeutic Activity: 8-22 mins G-Codes:    Jules Schick  [phone] 06/12/2014, 12:59 PM

## 2014-06-12 NOTE — Care Management Note (Signed)
Case Management Note  Patient Details  Name: Eric Wood MRN: [id number] Date of Birth: 11/20/1955  Subjective/Objective:    RIGHT TOTAL HIP ARTHROPLASTY                 Action/Plan: Home with Home Health. Lives with family  Expected Discharge Date:     06/13/2014             Expected Discharge Plan:  [facility]  In-House Referral:     Discharge planning Services  CM Consult  Post Acute Care Choice:  Home Health Choice offered to:  Patient  DME Arranged:  3-N-1, Walker rolling DME Agency:  [place]:  PT [place] Agency:  [place]  Status of Service:  Completed, signed off  Medicare Important Message Given:  N/A - LOS <3 / Initial given by admissions Date Medicare IM Given:    Medicare IM give by:    Date Additional Medicare IM Given:    Additional Medicare Important Message give by:     If discussed at [place] of Stay Meetings, dates discussed:    Additional Comments: Consult for [facility] and DME. Spoke to pt and offered choice for [facility]. Pt agreeable to [facility] for [facility]. Requesting RW and 3n1 for home. Notified [place] DME rep for deliver of equipment to room. Lives at home with his two brothers who will be able to assist him at home.  Erenest Rasher, RN 06/12/2014, 2:05 PM

## 2014-06-12 NOTE — Progress Notes (Signed)
Physical Therapy Treatment Patient Details Name: Eric Wood MRN: [id number] DOB: July 25, 1955 Today's Date: June 28, 2014    History of Present Illness R THR    PT Comments    Good spirits and motivated.  Therex performed but OOB deferred upon arrival of bfast.  Follow Up Recommendations  Home health PT     Equipment Recommendations  Rolling walker with 5" wheels    Recommendations for Other Services OT consult     Precautions / Restrictions Precautions Precautions: Fall Restrictions Weight Bearing Restrictions: No Other Position/Activity Restrictions: WBAT    Mobility  Bed Mobility               General bed mobility comments: OOB deferred to after bfast   Transfers                    Ambulation/Gait                 Stairs            Wheelchair Mobility    Modified Rankin (Stroke Patients Only)       Balance                                    Cognition Arousal/Alertness: Awake/alert Behavior During Therapy: WFL for tasks assessed/performed Overall Cognitive Status: Within Functional Limits for tasks assessed                      Exercises Total Joint Exercises Ankle Circles/Pumps: AROM;Both;20 reps;Supine Quad Sets: AROM;Both;10 reps;Supine Heel Slides: AAROM;Right;20 reps;Supine Hip ABduction/ADduction: AAROM;Right;15 reps;Supine    General Comments        Pertinent Vitals/Pain Pain Assessment: 0-10 Pain Score: 4  Pain Location: R hip Pain Descriptors / Indicators: Aching Pain Intervention(s): Limited activity within patient's tolerance;Monitored during session;Premedicated before session;Ice applied    Home Living                      Prior Function            PT Goals (current goals can now be found in the care plan section) Acute Rehab PT Goals Patient Stated Goal: Walk without pain PT Goal Formulation: With patient Time For Goal Achievement: 06/15/14 Potential to Achieve  Goals: Good Progress towards PT goals: Progressing toward goals    Frequency  7X/week    PT Plan Current plan remains appropriate    Co-evaluation             End of Session Equipment Utilized During Treatment: Gait belt Activity Tolerance: Patient tolerated treatment well Patient left: in bed;with call bell/phone within reach;with nursing/sitter in room     Time: 0806-0820 PT Time Calculation (min) (ACUTE ONLY): 14 min  Charges:  $Therapeutic Exercise: 8-22 mins                    G Codes:      Eric Wood 06/28/14, 8:24 AM

## 2014-06-12 NOTE — Progress Notes (Signed)
Subjective: 1 Day Post-Op Procedure(s) (LRB): RIGHT TOTAL HIP ARTHROPLASTY ANTERIOR APPROACH (Right) Patient reports pain as moderate.  Making progress with therapy.  Asymptomatic acute blood loss anemia.  Objective: Vital signs in last 24 hours: Temp:  [98.1 F (36.7 C)-99.9 F (37.7 C)] 99.9 F (37.7 C) (06/11 1126) Pulse Rate:  [68-99] 99 (06/11 1126) Resp:  [15-16] 16 (06/11 1126) BP: (115-166)/(58-94) 125/65 mmHg (06/11 1126) SpO2:  [95 %-100 %] 96 % (06/11 1126)  Intake/Output from previous day: 06/10 0701 - 06/11 0700 In: 2940.8 [P.O.:60; I.V.:2670.8; IV Piggyback:210] Out: 3545 [Urine:1170; Blood:300] Intake/Output this shift: Total I/O In: 360 [P.O.:360] Out: 575 [Urine:575]   Recent Labs  06/12/14 0430  HGB 10.7*    Recent Labs  06/12/14 0430  WBC 6.6  RBC 3.43*  HCT 31.5*  PLT 302    Recent Labs  06/12/14 0430  NA 134*  K 3.6  CL 100*  CO2 27  BUN 10  CREATININE 0.86  GLUCOSE 136*  CALCIUM 8.2*   No results for input(s): LABPT, INR in the last 72 hours.  Sensation intact distally Intact pulses distally Dorsiflexion/Plantar flexion intact Incision: dressing C/D/I  Assessment/Plan: 1 Day Post-Op Procedure(s) (LRB): RIGHT TOTAL HIP ARTHROPLASTY ANTERIOR APPROACH (Right) Up with therapy Discharge home with home health Monday  BLACKMAN,CHRISTOPHER Y 06/12/2014, 11:33 AM

## 2014-06-12 NOTE — Discharge Instructions (Signed)
INSTRUCTIONS AFTER JOINT REPLACEMENT  ° °o Remove items at home which could result in a fall. This includes throw rugs or furniture in walking pathways °o ICE to the affected joint every three hours while awake for 30 minutes at a time, for at least the first 3-5 days, and then as needed for pain and swelling.  Continue to use ice for pain and swelling. You may notice swelling that will progress down to the foot and ankle.  This is normal after surgery.  Elevate your leg when you are not up walking on it.   °o Continue to use the breathing machine you got in the hospital (incentive spirometer) which will help keep your temperature down.  It is common for your temperature to cycle up and down following surgery, especially at night when you are not up moving around and exerting yourself.  The breathing machine keeps your lungs expanded and your temperature down. ° ° °DIET:  As you were doing prior to hospitalization, we recommend a well-balanced diet. ° °DRESSING / WOUND CARE / SHOWERING ° °Keep the surgical dressing until follow up.  The dressing is water proof, so you can shower without any extra covering.  IF THE DRESSING FALLS OFF or the wound gets wet inside, change the dressing with sterile gauze.  Please use good hand washing techniques before changing the dressing.  Do not use any lotions or creams on the incision until instructed by your surgeon.   ° °ACTIVITY ° °o Increase activity slowly as tolerated, but follow the weight bearing instructions below.   °o No driving for 6 weeks or until further direction given by your physician.  You cannot drive while taking narcotics.  °o No lifting or carrying greater than 10 lbs. until further directed by your surgeon. °o Avoid periods of inactivity such as sitting longer than an hour when not asleep. This helps prevent blood clots.  °o You may return to work once you are authorized by your doctor.  ° ° ° °WEIGHT BEARING  ° °Weight bearing as tolerated with assist  device (walker, cane, etc) as directed, use it as long as suggested by your surgeon or therapist, typically at least 4-6 weeks. ° ° °EXERCISES ° °Results after joint replacement surgery are often greatly improved when you follow the exercise, range of motion and muscle strengthening exercises prescribed by your doctor. Safety measures are also important to protect the joint from further injury. Any time any of these exercises cause you to have increased pain or swelling, decrease what you are doing until you are comfortable again and then slowly increase them. If you have problems or questions, call your caregiver or physical therapist for advice.  ° °Rehabilitation is important following a joint replacement. After just a few days of immobilization, the muscles of the leg can become weakened and shrink (atrophy).  These exercises are designed to build up the tone and strength of the thigh and leg muscles and to improve motion. Often times heat used for twenty to thirty minutes before working out will loosen up your tissues and help with improving the range of motion but do not use heat for the first two weeks following surgery (sometimes heat can increase post-operative swelling).  ° °These exercises can be done on a training (exercise) mat, on the floor, on a table or on a bed. Use whatever works the best and is most comfortable for you.    Use music or television while you are exercising so that   the exercises are a pleasant break in your day. This will make your life better with the exercises acting as a break in your routine that you can look forward to.   Perform all exercises about fifteen times, three times per day or as directed.  You should exercise both the operative leg and the other leg as well. ° °Exercises include: °  °• Quad Sets - Tighten up the muscle on the front of the thigh (Quad) and hold for 5-10 seconds.   °• Straight Leg Raises - With your knee straight (if you were given a brace, keep it on),  lift the leg to 60 degrees, hold for 3 seconds, and slowly lower the leg.  Perform this exercise against resistance later as your leg gets stronger.  °• Leg Slides: Lying on your back, slowly slide your foot toward your buttocks, bending your knee up off the floor (only go as far as is comfortable). Then slowly slide your foot back down until your leg is flat on the floor again.  °• Angel Wings: Lying on your back spread your legs to the side as far apart as you can without causing discomfort.  °• Hamstring Strength:  Lying on your back, push your heel against the floor with your leg straight by tightening up the muscles of your buttocks.  Repeat, but this time bend your knee to a comfortable angle, and push your heel against the floor.  You may put a pillow under the heel to make it more comfortable if necessary.  ° °A rehabilitation program following joint replacement surgery can speed recovery and prevent re-injury in the future due to weakened muscles. Contact your doctor or a physical therapist for more information on knee rehabilitation.  ° ° °CONSTIPATION ° °Constipation is defined medically as fewer than three stools per week and severe constipation as less than one stool per week.  Even if you have a regular bowel pattern at home, your normal regimen is likely to be disrupted due to multiple reasons following surgery.  Combination of anesthesia, postoperative narcotics, change in appetite and fluid intake all can affect your bowels.  ° °YOU MUST use at least one of the following options; they are listed in order of increasing strength to get the job done.  They are all available over the counter, and you may need to use some, POSSIBLY even all of these options:   ° °Drink plenty of fluids (prune juice may be helpful) and high fiber foods °Colace 100 mg by mouth twice a day  °Senokot for constipation as directed and as needed Dulcolax (bisacodyl), take with full glass of water  °Miralax (polyethylene glycol)  once or twice a day as needed. ° °If you have tried all these things and are unable to have a bowel movement in the first 3-4 days after surgery call either your surgeon or your primary doctor.   ° °If you experience loose stools or diarrhea, hold the medications until you stool forms back up.  If your symptoms do not get better within 1 week or if they get worse, check with your doctor.  If you experience "the worst abdominal pain ever" or develop nausea or vomiting, please contact the office immediately for further recommendations for treatment. ° ° °ITCHING:  If you experience itching with your medications, try taking only a single pain pill, or even half a pain pill at a time.  You can also use Benadryl over the counter for itching or also to   help with sleep.   TED HOSE STOCKINGS:  Use stockings on both legs until for at least 2 weeks or as directed by physician office. They may be removed at night for sleeping.  MEDICATIONS:  See your medication summary on the After Visit Summary that nursing will review with you.  You may have some home medications which will be placed on hold until you complete the course of blood thinner medication.  It is important for you to complete the blood thinner medication as prescribed.  PRECAUTIONS:  If you experience chest pain or shortness of breath - call 911 immediately for transfer to the hospital emergency department.   If you develop a fever greater that 101 F, purulent drainage from wound, increased redness or drainage from wound, foul odor from the wound/dressing, or calf pain - CONTACT YOUR SURGEON.                                                   FOLLOW-UP APPOINTMENTS:  If you do not already have a post-op appointment, please call the office for an appointment to be seen by your surgeon.  Guidelines for how soon to be seen are listed in your After Visit Summary, but are typically between 1-4 weeks after surgery.  OTHER INSTRUCTIONS:   Knee  Replacement:  Do not place pillow under knee, focus on keeping the knee straight while resting. CPM instructions: 0-90 degrees, 2 hours in the morning, 2 hours in the afternoon, and 2 hours in the evening. Place foam block, curve side up under heel at all times except when in CPM or when walking.  DO NOT modify, tear, cut, or change the foam block in any way.  MAKE SURE YOU:   Understand these instructions.   Get help right away if you are not doing well or get worse.    Thank you for letting us be a part of your medical care team.  It is a privilege we respect greatly.  We hope these instructions will help you stay on track for a fast and full recovery!    Do get an over-the-counter stool softener to take twice daily as needed.

## 2014-06-13 LAB — GLUCOSE, CAPILLARY
GLUCOSE-CAPILLARY: 101 mg/dL — AB (ref 65–99)
GLUCOSE-CAPILLARY: 119 mg/dL — AB (ref 65–99)
GLUCOSE-CAPILLARY: 114 mg/dL — AB (ref 65–99)
Glucose-Capillary: 118 mg/dL — ABNORMAL HIGH (ref 65–99)

## 2014-06-13 NOTE — Progress Notes (Signed)
Physical Therapy Treatment Note    06/13/14 1500  PT Visit Information  Last PT Received On 06/13/14  Assistance Needed +1  History of Present Illness R THR  PT Time Calculation  PT Start Time (ACUTE ONLY) 1447  PT Stop Time (ACUTE ONLY) 1503  PT Time Calculation (min) (ACUTE ONLY) 16 min  Subjective Data  Subjective Pt assisted with ambulating in hallway again this afternoon.  Precautions  Precautions Fall  Restrictions  Other Position/Activity Restrictions WBAT  Pain Assessment  Pain Assessment 0-10  Pain Score 4  Pain Location R hip  Pain Descriptors / Indicators Aching;Sore  Pain Intervention(s) Limited activity within patient's tolerance;Monitored during session;Ice applied  Cognition  Arousal/Alertness Awake/alert  Behavior During Therapy WFL for tasks assessed/performed  Overall Cognitive Status Within Functional Limits for tasks assessed  Bed Mobility  General bed mobility comments Pt up in recliner  Transfers  Overall transfer level Needs assistance  Equipment used Rolling walker (2 wheeled)  Transfers Sit to/from Stand  Sit to Stand Min guard  General transfer comment verbal cues for UE and LE positioning  Ambulation/Gait  Ambulation/Gait assistance Min guard  Ambulation Distance (Feet) 60 Feet  Assistive device Rolling walker (2 wheeled)  General Gait Details pt reports feeling more stiff this afternoon, verbal cues for increasing R hip/knee flexion during swing, distance to pt tolerance  Gait Pattern/deviations Step-to pattern;Antalgic;Narrow base of support;Decreased step length - right  Gait velocity decr  PT - End of Session  Activity Tolerance Patient tolerated treatment well  Patient left in chair;with call bell/phone within reach  PT - Assessment/Plan  PT Plan Current plan remains appropriate  PT Frequency (ACUTE ONLY) 7X/week  Follow Up Recommendations Home health PT  PT equipment Rolling walker with 5" wheels  PT Goal Progression  Progress  towards PT goals Progressing toward goals  PT General Charges  $$ ACUTE PT VISIT 1 Procedure  PT Treatments  $Gait Training 8-22 mins   Carmelia Bake, PT, DPT 06/13/2014 Pager: [phone]

## 2014-06-13 NOTE — Progress Notes (Signed)
Physical Therapy Treatment Patient Details Name: Eric Wood MRN: [id number] DOB: [birth year] Today's Date: 06-27-14    History of Present Illness R THR    PT Comments    Marked improvement in activity tolerance  Follow Up Recommendations  Home health PT     Equipment Recommendations  Rolling walker with 5" wheels    Recommendations for Other Services OT consult     Precautions / Restrictions Precautions Precautions: Fall Restrictions Weight Bearing Restrictions: No Other Position/Activity Restrictions: WBAT    Mobility  Bed Mobility Overal bed mobility: Needs Assistance Bed Mobility: Supine to Sit     Supine to sit: Min assist     General bed mobility comments: cues for sequence  Transfers Overall transfer level: Needs assistance Equipment used: Rolling walker (2 wheeled) Transfers: Sit to/from Stand Sit to Stand: From elevated surface;Min guard         General transfer comment: cues for hand placement and LE management. min assist to rise and steady.   Ambulation/Gait Ambulation/Gait assistance: Min guard Ambulation Distance (Feet): 200 Feet Assistive device: Rolling walker (2 wheeled) Gait Pattern/deviations: Step-to pattern;Step-through pattern;Decreased step length - right;Decreased step length - left;Shuffle;Trunk flexed;Narrow base of support Gait velocity: decr   General Gait Details: Min cues for posture and position from [employer] Mobility    Modified Rankin (Stroke Patients Only)       Balance                                    Cognition Arousal/Alertness: Awake/alert Behavior During Therapy: WFL for tasks assessed/performed Overall Cognitive Status: Within Functional Limits for tasks assessed                      Exercises Total Joint Exercises Ankle Circles/Pumps: AROM;Both;20 reps;Supine Quad Sets: AROM;Both;10 reps;Supine Gluteal Sets: AROM;Both;10 reps;Supine Heel  Slides: AAROM;Right;20 reps;Supine Hip ABduction/ADduction: AAROM;Right;15 reps;Supine    General Comments        Pertinent Vitals/Pain Pain Assessment: 0-10 Pain Score: 5  Pain Location: R hip Pain Descriptors / Indicators: Aching;Sore Pain Intervention(s): Limited activity within patient's tolerance;Monitored during session;Premedicated before session;Ice applied    Home Living                      Prior Function            PT Goals (current goals can now be found in the care plan section) Acute Rehab PT Goals Patient Stated Goal: decrease pain PT Goal Formulation: With patient Time For Goal Achievement: 06/15/14 Potential to Achieve Goals: Good Progress towards PT goals: Progressing toward goals    Frequency  7X/week    PT Plan Current plan remains appropriate    Co-evaluation             End of Session Equipment Utilized During Treatment: Gait belt Activity Tolerance: Patient tolerated treatment well Patient left: with call bell/phone within reach;in chair     Time: 2229-7989 PT Time Calculation (min) (ACUTE ONLY): 34 min  Charges:  $Gait Training: 8-22 mins $Therapeutic Exercise: 8-22 mins                    G Codes:      Eric Wood 06-27-2014, 10:01 AM

## 2014-06-13 NOTE — Progress Notes (Signed)
Subjective: Patient stable has been walking with improving hip pain - I told him he could probably drive within 3-4 weeks once he is not taking pain meds   Objective: Vital signs in last 24 hours: Temp:  [98.6 F (37 C)-99.9 F (37.7 C)] 98.6 F (37 C) (06/12 0552) Pulse Rate:  [86-102] 89 (06/12 0730) Resp:  [14-16] 16 (06/12 0552) BP: (91-153)/(49-80) 120/71 mmHg (06/12 0730) SpO2:  [94 %-97 %] 94 % (06/12 0552)  Intake/Output from previous day: 06/11 0701 - 06/12 0700 In: 1800 [P.O.:1800] Out: 1825 [Urine:1825] Intake/Output this shift: Total I/O In: 240 [P.O.:240] Out: 300 [Urine:300]  Exam:  Dorsiflexion/Plantar flexion intact  Labs:  Recent Labs  06/12/14 0430  HGB 10.7*    Recent Labs  06/12/14 0430  WBC 6.6  RBC 3.43*  HCT 31.5*  PLT 302    Recent Labs  06/12/14 0430  NA 134*  K 3.6  CL 100*  CO2 27  BUN 10  CREATININE 0.86  GLUCOSE 136*  CALCIUM 8.2*   No results for input(s): LABPT, INR in the last 72 hours.  Assessment/Plan: Plan to mobilize today with PT discharge tomorrow   Eric Wood SCOTT 06/13/2014, 9:20 AM

## 2014-06-13 NOTE — Progress Notes (Signed)
Pt refused cpap tonight.  Machine in room.  Pt was advised that RT is available all night and encouraged him to call, should he change his mind.  RN aware.

## 2014-06-13 NOTE — Progress Notes (Signed)
Occupational Therapy Treatment Patient Details Name: Eric Wood MRN: [id number] DOB: [birth year] Today's Date: 06/13/2014    History of present illness R THR   OT comments  Patient progressing well towards OT goals. Will check on patient once more prior to discharge to address any questions/concerns but all education regarding ADLs has been completed  Follow Up Recommendations  Home health OT;Supervision/Assistance - 24 hour    Equipment Recommendations  3 in 1 bedside comode    Recommendations for Other Services      Precautions / Restrictions Precautions Precautions: Fall Restrictions Weight Bearing Restrictions: No Other Position/Activity Restrictions: WBAT       Mobility Bed Mobility               General bed mobility comments: Pt up in recliner  Transfers Overall transfer level: Needs assistance Equipment used: Rolling walker (2 wheeled) Transfers: Sit to/from Stand Sit to Stand: Min guard         General transfer comment: verbal cues for UE and LE positioning    Balance                                   ADL Overall ADL's : Needs assistance/impaired Eating/Feeding: Independent;Sitting   Grooming: Wash/dry hands;Wash/dry face;Oral care;Set up;Sitting               Lower Body Dressing: Minimal assistance;Sit to/from stand (pt has sock aide and shoe horn at home; uses cane as a reach)               Functional mobility during ADLs: Min guard;Rolling walker General ADL Comments: Patient declined practicing 3 in 1 transfer, stating he practiced earlier today and felt it went well. Reports he has tub/shower and remembers he has a shower seat that he used when caring for his father. Recommend that patient sponge bathe initially because he does not want his brothers to assist with showering, then use shower seat in tub when he feels more confident with his mobility. Patient in agreement. Even PTA, patient using cane to don  pants/underwear, and sock aide/shoe horn to don socks and diabetic shoes. Educated patient on LB dressing techniques and he verbalized understanding.       Vision                     Perception     Praxis      Cognition   Behavior During Therapy: WFL for tasks assessed/performed Overall Cognitive Status: Within Functional Limits for tasks assessed                       Extremity/Trunk Assessment               Exercises     Shoulder Instructions       General Comments      Pertinent Vitals/ Pain       Pain Assessment: 0-10 Pain Score: 4  Pain Location: R hip Pain Descriptors / Indicators: Aching;Sore Pain Intervention(s): Limited activity within patient's tolerance  Home Living                                          Prior Functioning/Environment              Frequency Min 2X/week  Progress Toward Goals  OT Goals(current goals can now be found in the care plan section)  Progress towards OT goals: Progressing toward goals     Plan Discharge plan remains appropriate    Co-evaluation                 End of Session     Activity Tolerance Patient limited by pain   Patient Left in chair;with call bell/phone within reach   Nurse Communication          Time: 6712-4580 OT Time Calculation (min): 13 min  Charges: OT General Charges $OT Visit: 1 Procedure OT Treatments $Self Care/Home Management : 8-22 mins  Eric Wood A 06/13/2014, 6:22 PM

## 2014-06-14 LAB — GLUCOSE, CAPILLARY: GLUCOSE-CAPILLARY: 106 mg/dL — AB (ref 65–99)

## 2014-06-14 NOTE — Progress Notes (Signed)
Discussed discharge instructions with pt. Pt had no questions or concern. VS stable and incision WNL. PT and OT have signed off on pt's discharge. PT has all necessary DME at discharge.

## 2014-06-14 NOTE — Progress Notes (Signed)
Subjective: 3 Days Post-Op Procedure(s) (LRB): RIGHT TOTAL HIP ARTHROPLASTY ANTERIOR APPROACH (Right) Patient reports pain as mild No complaints otherwise wanting to go home today..    Objective: Vital signs in last 24 hours: Temp:  [98.2 F (36.8 C)-99.5 F (37.5 C)] 98.7 F (37.1 C) (06/13 0608) Pulse Rate:  [83-97] 97 (06/13 0608) Resp:  [16] 16 (06/13 0608) BP: (110-134)/(59-68) 134/68 mmHg (06/13 0608) SpO2:  [95 %-97 %] 97 % (06/13 0608)  Intake/Output from previous day: 06/12 0701 - 06/13 0700 In: 1440 [P.O.:1440] Out: 1900 [Urine:1900] Intake/Output this shift:     Recent Labs  06/12/14 0430  HGB 10.7*    Recent Labs  06/12/14 0430  WBC 6.6  RBC 3.43*  HCT 31.5*  PLT 302    Recent Labs  06/12/14 0430  NA 134*  K 3.6  CL 100*  CO2 27  BUN 10  CREATININE 0.86  GLUCOSE 136*  CALCIUM 8.2*   No results for input(s): LABPT, INR in the last 72 hours.   Right lower Extremity: Neurovascular intact Sensation intact distally Intact pulses distally Dorsiflexion/Plantar flexion intact Incision: scant drainage Compartment soft  Assessment/Plan: 3 Days Post-Op Procedure(s) (LRB): RIGHT TOTAL HIP ARTHROPLASTY ANTERIOR APPROACH (Right) Up with therapy Discharge home with home health  Erskine Emery 06/14/2014, 7:35 AM

## 2014-06-14 NOTE — Progress Notes (Signed)
Physical Therapy Treatment Patient Details Name: Eric Wood MRN: [id number] DOB: 1955-11-28 Today's Date: 06/14/2014    History of Present Illness R THR    PT Comments    POD # 3 pt plans to D/C to home today.  Assisted with amb in hallway and practiced going up and down 4 steps using one rail and one crutch.  Handouts given on stairs plus THR HEP.  Instructed on proper tech and freq as well as use of ICE .    Follow Up Recommendations  Home health PT     Equipment Recommendations  Rolling walker with 5" wheels    Recommendations for Other Services       Precautions / Restrictions Precautions Precautions: Fall Restrictions Weight Bearing Restrictions: No Other Position/Activity Restrictions: WBAT    Mobility  Bed Mobility               General bed mobility comments: Pt up in recliner  Transfers Overall transfer level: Needs assistance Equipment used: Rolling walker (2 wheeled) Transfers: Sit to/from Stand Sit to Stand: Supervision         General transfer comment: supervision for safety.  Ambulation/Gait Ambulation/Gait assistance: Supervision Ambulation Distance (Feet): 85 Feet Assistive device: Rolling walker (2 wheeled) Gait Pattern/deviations: Step-through pattern;Decreased stride length;Trunk flexed Gait velocity: decreased   General Gait Details: one VC safety with turns using RW   Stairs Stairs: Yes Stairs assistance: Supervision Stair Management: One rail Right;Step to pattern;Forwards;With crutches Number of Stairs: 4 General stair comments: 50% VC's on proper sequencing and use of one crutch and one rail.  Handout also given.   Wheelchair Mobility    Modified Rankin (Stroke Patients Only)       Balance                                    Cognition Arousal/Alertness: Awake/alert Behavior During Therapy: WFL for tasks assessed/performed Overall Cognitive Status: Within Functional Limits for tasks assessed                       Exercises      General Comments        Pertinent Vitals/Pain Pain Assessment: 0-10 Pain Score: 2  Pain Location: R hip Pain Descriptors / Indicators: Aching;Sore Pain Intervention(s): Monitored during session;Repositioned;Ice applied    Home Living                      Prior Function            PT Goals (current goals can now be found in the care plan section) Progress towards PT goals: Progressing toward goals    Frequency  7X/week    PT Plan Current plan remains appropriate    Co-evaluation             End of Session Equipment Utilized During Treatment: Gait belt Activity Tolerance: Patient tolerated treatment well Patient left: in chair;with call bell/phone within reach     Time: 0952-1029 PT Time Calculation (min) (ACUTE ONLY): 37 min  Charges:  $Gait Training: 8-22 mins $Therapeutic Activity: 8-22 mins                    G Codes:      Rica Koyanagi  PTA [facility]  Acute  Rehab Pager      [phone]

## 2014-06-14 NOTE — Progress Notes (Addendum)
Occupational Therapy Treatment Patient Details Name: Eric Wood MRN: [id number] DOB: [birth year] Today's Date: 06/14/2014    History of present illness R THR   OT comments  Pt to d./c today. Educated further on AE options including reacher for LB dressing and practiced up to 3in1. Pt moves a little quickly so cautioned him to slow down for safety. He plans to purchase reacher from gift shop as well as leg lifter. Brother present during session. Recommended pt initially sponge bathe a day or so especially since he doesn't want anyone to help him when he takes a shower.  Pt stating many times he will be fine taking a shower when he decides to and "wont need help."    Follow Up Recommendations  Home health OT;Supervision/Assistance - 24 hour    Equipment Recommendations  3 in 1 bedside comode    Recommendations for Other Services      Precautions / Restrictions Precautions Precautions: Fall Restrictions Weight Bearing Restrictions: No Other Position/Activity Restrictions: WBAT       Mobility Bed Mobility               General bed mobility comments: Pt up in recliner  Transfers Overall transfer level: Needs assistance Equipment used: Rolling walker (2 wheeled) Transfers: Sit to/from Stand Sit to Stand: Supervision         General transfer comment: supervision for safety.    Balance                                   ADL                       Lower Body Dressing: Minimal assistance;Sit to/from stand;With adaptive equipment Lower Body Dressing Details (indicate cue type and reason): pt used reacher to don underwear and pants. he verbalized understanding of how to use sock aid as he uses it at home. demonstrated how to use the reacher to push sock over heel to doff but declined to take all the way off. Pt used shoe horn to don velcro shoes with only slight min assist to manage back of shoe so it didnt tuck in.  Toilet Transfer: Min  guard;Ambulation;BSC;RW   Toileting- Water quality scientist and Hygiene: Min guard;Sit to/from stand         General ADL Comments: pt states he will sponge bathe initially but decilnes needing tub transfer bench. He states he has a shower seat that fits in tub. Pt's brother present and states it is wide enough to sit down on seat from outside of tub and pivot LEs around into tub while seated. Attempted to explain to pt to make sure he feels ready adn strong enough before showerig but pt interrupting this therapist several times stating he would be just fine and doesnt need assist with showering. Brother turned to therapist and stated that pt will call for help if he needs it. Pt plans to purchase reacher from gift shop as he d/c hospital today.      Vision                     Perception     Praxis      Cognition   Behavior During Therapy: [facility] for tasks assessed/performed Overall Cognitive Status: Within Functional Limits for tasks assessed  Extremity/Trunk Assessment               Exercises     Shoulder Instructions       General Comments      Pertinent Vitals/ Pain       Pain Assessment: No/denies pain  Home Living                                          Prior Functioning/Environment              Frequency Min 2X/week     Progress Toward Goals  OT Goals(current goals can now be found in the care plan section)  Progress towards OT goals: Progressing toward goals     Plan Discharge plan remains appropriate    Co-evaluation                 End of Session Equipment Utilized During Treatment: Rolling walker   Activity Tolerance Patient tolerated treatment well   Patient Left in chair;with call bell/phone within reach;with family/visitor present   Nurse Communication          Time: 7048-8891 OT Time Calculation (min): 21 min  Charges: OT General Charges $OT Visit: 1 Procedure OT  Treatments $Self Care/Home Management : 8-22 mins  Jules Schick  [phone] 06/14/2014, 12:01 PM

## 2014-06-14 NOTE — Care Management Note (Signed)
Case Management Note  Patient Details  Name: Eric Wood MRN: [id number] Date of Birth: July 04, 1955  Expected Discharge Date:                  Expected Discharge Plan:  [place]  In-House Referral:     Discharge planning Services  CM Consult  Post Acute Care Choice:  Home Health Choice offered to:  Patient  DME Arranged:  3-N-1, Walker rolling DME Agency:  [place]:  PT [place]:  [place]  Status of Service:  Completed, signed off  Medicare Important Message Given:  Yes Date Medicare IM Given:  06/14/14 Medicare IM give by:  Sunday Spillers RN CM  Date Additional Medicare IM Given:    Additional Medicare Important Message give by:     If discussed at [place] of Stay Meetings, dates discussed:    Additional Comments:  Guadalupe Maple, RN 06/14/2014, 9:40 AM

## 2014-06-14 NOTE — Discharge Summary (Signed)
Patient ID: Eric Wood MRN: [id number] DOB/AGE: 04/19/1955 59 y.o.  Admit date: 06/11/2014 Discharge date: 06/14/2014  Admission Diagnoses:  Principal Problem:   Osteoarthritis of right hip Active Problems:   Status post total replacement of right hip   Discharge Diagnoses:  Same  Past Medical History  Diagnosis Date  . Hypertension   . Hypercholesteremia     under control  . Diabetes mellitus     type 2  . Arthritis   . Back pain     acupuncture  . Family history of adverse reaction to anesthesia     Severe PONV    Surgeries: Procedure(s): RIGHT TOTAL HIP ARTHROPLASTY ANTERIOR APPROACH on 06/11/2014   Consultants:    Discharged Condition: Improved  Hospital Course: Eric Wood is an 59 y.o. male who was admitted 06/11/2014 for operative treatment ofOsteoarthritis of right hip. Patient has severe unremitting pain that affects sleep, daily activities, and work/hobbies. After pre-op clearance the patient was taken to the operating room on 06/11/2014 and underwent  Procedure(s): RIGHT TOTAL HIP ARTHROPLASTY ANTERIOR APPROACH.    Patient was given perioperative antibiotics: Anti-infectives    Start     Dose/Rate Route Frequency Ordered Stop   06/11/14 1400  ceFAZolin (ANCEF) IVPB 1 g/50 mL premix     1 g 100 mL/hr over 30 Minutes Intravenous Every 6 hours 06/11/14 1116 06/11/14 1943   06/11/14 0601  ceFAZolin (ANCEF) IVPB 2 g/50 mL premix     2 g 100 mL/hr over 30 Minutes Intravenous On call to O.R. 06/11/14 0601 06/11/14 1062       Patient was given sequential compression devices, early ambulation, and chemoprophylaxis to prevent DVT.  Patient benefited maximally from hospital stay and there were no complications.    Recent vital signs: Patient Vitals for the past 24 hrs:  BP Temp Temp src Pulse Resp SpO2  06/14/14 0608 134/68 mmHg 98.7 F (37.1 C) Oral 97 16 97 %  06/13/14 2133 117/64 mmHg 99.5 F (37.5 C) Oral 92 16 95 %  06/13/14 1348 110/66 mmHg 98.2 F  (36.8 C) Oral 83 16 95 %  06/13/14 1125 (!) 115/59 mmHg - - 88 - -     Recent laboratory studies:  Recent Labs  06/12/14 0430  WBC 6.6  HGB 10.7*  HCT 31.5*  PLT 302  NA 134*  K 3.6  CL 100*  CO2 27  BUN 10  CREATININE 0.86  GLUCOSE 136*  CALCIUM 8.2*     Discharge Medications:     Medication List    STOP taking these medications        acetaminophen-codeine 300-30 MG per tablet  Commonly known as:  TYLENOL #3     HYDROcodone-acetaminophen 5-325 MG per tablet  Commonly known as:  NORCO/VICODIN      TAKE these medications        amitriptyline 25 MG tablet  Commonly known as:  ELAVIL  Take 25 mg by mouth at bedtime.     aspirin 325 MG EC tablet  Take 1 tablet (325 mg total) by mouth 2 (two) times daily after a meal.     atenolol 25 MG tablet  Commonly known as:  TENORMIN  Take 25 mg by mouth every morning.     glipiZIDE 10 MG tablet  Commonly known as:  GLUCOTROL  Take 10 mg by mouth 2 (two) times daily before a meal.     lisinopril 10 MG tablet  Commonly known as:  PRINIVIL,ZESTRIL  Take 20 mg  by mouth every morning.     metFORMIN 500 MG tablet  Commonly known as:  GLUCOPHAGE  Take 500 mg by mouth 2 (two) times daily with a meal.     methocarbamol 500 MG tablet  Commonly known as:  ROBAXIN  Take 1 tablet (500 mg total) by mouth every 6 (six) hours as needed for muscle spasms.     oxyCODONE-acetaminophen 5-325 MG per tablet  Commonly known as:  ROXICET  Take 1-2 tablets by mouth every 4 (four) hours as needed.     PRESCRIPTION MEDICATION  Apply 1 application topically 2 (two) times daily as needed (dry skin). Lotion he receives from the [place]     simvastatin 10 MG tablet  Commonly known as:  ZOCOR  Take 10 mg by mouth at bedtime.     traMADol 50 MG tablet  Commonly known as:  ULTRAM  Take 50 mg by mouth 2 (two) times daily as needed for moderate pain.     UNABLE TO FIND  C Pap        Diagnostic Studies: Dg C-arm 61-120 Min-no  Report  06/11/2014   CLINICAL DATA: surgery   C-ARM 61-120 MINUTES  Fluoroscopy was utilized by the requesting physician.  No radiographic  interpretation.    Dg Hip Port Unilat With Pelvis 1v Right  06/11/2014   CLINICAL DATA:  Right hip osteoarthritis. Status post right hip replacement.  EXAM: RIGHT HIP (WITH PELVIS) 1 VIEW PORTABLE  COMPARISON:  None.  FINDINGS: Bipolar right hip prosthesis is seen in appropriate position. No evidence of fracture or dislocation.  Severe end-stage left hip osteoarthritis noted as well as lower lumbar spine degenerative changes.  IMPRESSION: Expected postoperative appearance of right hip prosthesis. No evidence of fracture or other complication.  Severe end-stage left hip osteoarthritis and lower lumbar spondylosis.   Electronically Signed   By: Earle Gell M.D.   On: 06/11/2014 10:05   Dg Hip Operative Unilat With Pelvis Right  06/11/2014   CLINICAL DATA:  Status post total right hip arthroplasty.  EXAM: OPERATIVE right HIP (WITH PELVIS IF PERFORMED) 2 VIEWS  TECHNIQUE: Fluoroscopic spot image(s) were submitted for interpretation post-operatively.  FLUOROSCOPY TIME:  Radiation Exposure Index (as provided by the fluoroscopic device):  If the device does not provide the exposure index:  Fluoroscopy Time:  dictate in minutes and seconds  Number of Acquired Images:  COMPARISON:  None.  FINDINGS: The acetabular and femoral components are well seated. No complicating features are demonstrated.  IMPRESSION: Well seated components of a total right hip arthroplasty.   Electronically Signed   By: Marijo Sanes M.D.   On: 06/11/2014 09:15    Disposition: 01-Home or Self Care        Follow-up Information    Follow up with Mcarthur Rossetti, MD In 2 weeks.   Specialty:  Orthopedic Surgery   Contact information:   [place] [address] [phone]       Follow up with [facility].   Why:  Home Health Physical Therapy   Contact  information:   [address] [place] [address] [phone]        Signed: Erskine Emery 06/14/2014, 7:47 AM

## 2014-06-16 DIAGNOSIS — Z9181 History of falling: Secondary | ICD-10-CM | POA: Diagnosis not present

## 2014-06-16 DIAGNOSIS — Z96641 Presence of right artificial hip joint: Secondary | ICD-10-CM | POA: Diagnosis not present

## 2014-06-16 DIAGNOSIS — Z471 Aftercare following joint replacement surgery: Secondary | ICD-10-CM | POA: Diagnosis not present

## 2014-06-16 DIAGNOSIS — M1990 Unspecified osteoarthritis, unspecified site: Secondary | ICD-10-CM | POA: Diagnosis not present

## 2014-06-16 DIAGNOSIS — I10 Essential (primary) hypertension: Secondary | ICD-10-CM | POA: Diagnosis not present

## 2014-06-16 DIAGNOSIS — E119 Type 2 diabetes mellitus without complications: Secondary | ICD-10-CM | POA: Diagnosis not present

## 2014-06-18 DIAGNOSIS — Z471 Aftercare following joint replacement surgery: Secondary | ICD-10-CM | POA: Diagnosis not present

## 2014-06-18 DIAGNOSIS — Z96641 Presence of right artificial hip joint: Secondary | ICD-10-CM | POA: Diagnosis not present

## 2014-06-18 DIAGNOSIS — I10 Essential (primary) hypertension: Secondary | ICD-10-CM | POA: Diagnosis not present

## 2014-06-18 DIAGNOSIS — Z9181 History of falling: Secondary | ICD-10-CM | POA: Diagnosis not present

## 2014-06-18 DIAGNOSIS — M1990 Unspecified osteoarthritis, unspecified site: Secondary | ICD-10-CM | POA: Diagnosis not present

## 2014-06-18 DIAGNOSIS — E119 Type 2 diabetes mellitus without complications: Secondary | ICD-10-CM | POA: Diagnosis not present

## 2014-06-21 DIAGNOSIS — I10 Essential (primary) hypertension: Secondary | ICD-10-CM | POA: Diagnosis not present

## 2014-06-21 DIAGNOSIS — E119 Type 2 diabetes mellitus without complications: Secondary | ICD-10-CM | POA: Diagnosis not present

## 2014-06-21 DIAGNOSIS — Z471 Aftercare following joint replacement surgery: Secondary | ICD-10-CM | POA: Diagnosis not present

## 2014-06-21 DIAGNOSIS — M1990 Unspecified osteoarthritis, unspecified site: Secondary | ICD-10-CM | POA: Diagnosis not present

## 2014-06-21 DIAGNOSIS — Z9181 History of falling: Secondary | ICD-10-CM | POA: Diagnosis not present

## 2014-06-21 DIAGNOSIS — Z96641 Presence of right artificial hip joint: Secondary | ICD-10-CM | POA: Diagnosis not present

## 2014-06-22 DIAGNOSIS — M25551 Pain in right hip: Secondary | ICD-10-CM | POA: Diagnosis not present

## 2014-06-22 DIAGNOSIS — M1611 Unilateral primary osteoarthritis, right hip: Secondary | ICD-10-CM | POA: Diagnosis not present

## 2014-06-23 DIAGNOSIS — Z96641 Presence of right artificial hip joint: Secondary | ICD-10-CM | POA: Diagnosis not present

## 2014-06-23 DIAGNOSIS — Z471 Aftercare following joint replacement surgery: Secondary | ICD-10-CM | POA: Diagnosis not present

## 2014-06-23 DIAGNOSIS — Z9181 History of falling: Secondary | ICD-10-CM | POA: Diagnosis not present

## 2014-06-23 DIAGNOSIS — E119 Type 2 diabetes mellitus without complications: Secondary | ICD-10-CM | POA: Diagnosis not present

## 2014-06-23 DIAGNOSIS — M1990 Unspecified osteoarthritis, unspecified site: Secondary | ICD-10-CM | POA: Diagnosis not present

## 2014-06-23 DIAGNOSIS — I10 Essential (primary) hypertension: Secondary | ICD-10-CM | POA: Diagnosis not present

## 2014-06-25 DIAGNOSIS — Z9181 History of falling: Secondary | ICD-10-CM | POA: Diagnosis not present

## 2014-06-25 DIAGNOSIS — Z96641 Presence of right artificial hip joint: Secondary | ICD-10-CM | POA: Diagnosis not present

## 2014-06-25 DIAGNOSIS — M1990 Unspecified osteoarthritis, unspecified site: Secondary | ICD-10-CM | POA: Diagnosis not present

## 2014-06-25 DIAGNOSIS — Z471 Aftercare following joint replacement surgery: Secondary | ICD-10-CM | POA: Diagnosis not present

## 2014-06-25 DIAGNOSIS — E119 Type 2 diabetes mellitus without complications: Secondary | ICD-10-CM | POA: Diagnosis not present

## 2014-06-25 DIAGNOSIS — I10 Essential (primary) hypertension: Secondary | ICD-10-CM | POA: Diagnosis not present

## 2014-08-03 ENCOUNTER — Other Ambulatory Visit (HOSPITAL_COMMUNITY): Payer: Self-pay | Admitting: Orthopaedic Surgery

## 2014-08-13 ENCOUNTER — Inpatient Hospital Stay (HOSPITAL_COMMUNITY): Admission: RE | Admit: 2014-08-13 | Payer: Medicare Other | Source: Ambulatory Visit

## 2014-08-16 ENCOUNTER — Encounter (HOSPITAL_COMMUNITY)
Admission: RE | Admit: 2014-08-16 | Discharge: 2014-08-16 | Disposition: A | Discharge status: Home / Self Care | Payer: Medicare Other | Source: Ambulatory Visit | Attending: Orthopaedic Surgery | Admitting: Orthopaedic Surgery

## 2014-08-16 ENCOUNTER — Encounter (HOSPITAL_COMMUNITY): Payer: Self-pay

## 2014-08-16 DIAGNOSIS — Z01818 Encounter for other preprocedural examination: Secondary | ICD-10-CM | POA: Insufficient documentation

## 2014-08-16 DIAGNOSIS — M1612 Unilateral primary osteoarthritis, left hip: Secondary | ICD-10-CM | POA: Diagnosis not present

## 2014-08-16 HISTORY — DX: Sleep apnea, unspecified: G47.30

## 2014-08-16 LAB — BASIC METABOLIC PANEL
Anion gap: 8 (ref 5–15)
BUN: 9 mg/dL (ref 6–20)
CHLORIDE: 104 mmol/L (ref 101–111)
CO2: 27 mmol/L (ref 22–32)
CREATININE: 1.06 mg/dL (ref 0.61–1.24)
Calcium: 9.2 mg/dL (ref 8.9–10.3)
GFR calc non Af Amer: >60 mL/min (ref >60)
Glucose, Bld: 183 mg/dL — ABNORMAL HIGH (ref 65–99)
POTASSIUM: 3.8 mmol/L (ref 3.5–5.1)
SODIUM: 139 mmol/L (ref 135–145)

## 2014-08-16 LAB — CBC
HEMATOCRIT: 36.1 % — AB (ref 39.0–52.0)
Hemoglobin: 11.9 g/dL — ABNORMAL LOW (ref 13.0–17.0)
MCH: 29.4 pg (ref 26.0–34.0)
MCHC: 33 g/dL (ref 30.0–36.0)
MCV: 89.1 fL (ref 78.0–100.0)
Platelets: 359 10*3/uL (ref 150–400)
RBC: 4.05 MIL/uL — AB (ref 4.22–5.81)
RDW: 13.6 % (ref 11.5–15.5)
WBC: 3.7 10*3/uL — AB (ref 4.0–10.5)

## 2014-08-16 LAB — SURGICAL PCR SCREEN
MRSA, PCR: NEGATIVE
Staphylococcus aureus: NEGATIVE

## 2014-08-16 LAB — PROTIME-INR
INR: 0.95 (ref 0.00–1.49)
Prothrombin Time: 12.9 seconds (ref 11.6–15.2)

## 2014-08-16 LAB — APTT: APTT: 33 s (ref 24–37)

## 2014-08-16 NOTE — Progress Notes (Signed)
EKG-06/11/2014- EPIC

## 2014-08-16 NOTE — Patient Instructions (Signed)
Eric Wood  08/16/2014   Your procedure is scheduled on:   08/20/2014    Report to [facility] Main  Entrance take [facility]  elevators to 3rd floor to  [place] at     1240pm  Call this number if you have problems the morning of surgery [phone]   Remember: ONLY 1 PERSON MAY GO WITH YOU TO SHORT STAY TO GET  READY MORNING OF YOUR SURGERY.  Do not eat food after midnite .  May have clear liquids from 12 midnite until 0800am then nothing by mouth.       Take these medicines the morning of surgery with A SIP OF WATER: Atenolol ( Tenormin)                               You may not have any metal on your body including hair pins and              piercings  Do not wear jewelry,  lotions, powders or perfumes, deodorant                       Men may shave face and neck.   Do not bring valuables to the hospital. [place].  Contacts, dentures or bridgework may not be worn into surgery.  Leave suitcase in the car. After surgery it may be brought to your room.        Special Instructions: coughing and deep breathing exercises, leg exercises               Please read over the following fact sheets you were given: _____________________________________________________________________             [facility] - Preparing for Surgery Before surgery, you can play an important role.  Because skin is not sterile, your skin needs to be as free of germs as possible.  You can reduce the number of germs on your skin by washing with CHG (chlorahexidine gluconate) soap before surgery.  CHG is an antiseptic cleaner which kills germs and bonds with the skin to continue killing germs even after washing. Please DO NOT use if you have an allergy to CHG or antibacterial soaps.  If your skin becomes reddened/irritated stop using the CHG and inform your nurse when you arrive at Short Stay. Do not shave (including legs and underarms)  for at least 48 hours prior to the first CHG shower.  You may shave your face/neck. Please follow these instructions carefully:  1.  Shower with CHG Soap the night before surgery and the  morning of Surgery.  2.  If you choose to wash your hair, wash your hair first as usual with your  normal  shampoo.  3.  After you shampoo, rinse your hair and body thoroughly to remove the  shampoo.                           4.  Use CHG as you would any other liquid soap.  You can apply chg directly  to the skin and wash  Gently with a scrungie or clean washcloth.  5.  Apply the CHG Soap to your body ONLY FROM THE NECK DOWN.   Do not use on face/ open                           Wound or open sores. Avoid contact with eyes, ears mouth and genitals (private parts).                       Wash face,  Genitals (private parts) with your normal soap.             6.  Wash thoroughly, paying special attention to the area where your surgery  will be performed.  7.  Thoroughly rinse your body with warm water from the neck down.  8.  DO NOT shower/wash with your normal soap after using and rinsing off  the CHG Soap.                9.  Pat yourself dry with a clean towel.            10.  Wear clean pajamas.            11.  Place clean sheets on your bed the night of your first shower and do not  sleep with pets. Day of Surgery : Do not apply any lotions/deodorants the morning of surgery.  Please wear clean clothes to the hospital/surgery center.  FAILURE TO FOLLOW THESE INSTRUCTIONS MAY RESULT IN THE CANCELLATION OF YOUR SURGERY PATIENT SIGNATURE_________________________________  NURSE SIGNATURE__________________________________  ________________________________________________________________________    CLEAR LIQUID DIET   Foods Allowed                                                                     Foods Excluded  Coffee and tea, regular and decaf                             liquids  that you cannot  Plain Jell-O in any flavor                                             see through such as: Fruit ices (not with fruit pulp)                                     milk, soups, orange juice  Iced Popsicles                                    All solid food Carbonated beverages, regular and diet                                    Cranberry, grape and apple juices Sports drinks like Gatorade Lightly seasoned clear broth or consume(fat free) Sugar, honey syrup  Sample Menu Breakfast                                Lunch                                     Supper Cranberry juice                    Beef broth                            Chicken broth Jell-O                                     Grape juice                           Apple juice Coffee or tea                        Jell-O                                      Popsicle                                                Coffee or tea                        Coffee or tea  _____________________________________________________________________   WHAT IS A BLOOD TRANSFUSION? Blood Transfusion Information  A transfusion is the replacement of blood or some of its parts. Blood is made up of multiple cells which provide different functions.  Red blood cells carry oxygen and are used for blood loss replacement.  White blood cells fight against infection.  Platelets control bleeding.  Plasma helps clot blood.  Other blood products are available for specialized needs, such as hemophilia or other clotting disorders. BEFORE THE TRANSFUSION  Who gives blood for transfusions?   Healthy volunteers who are fully evaluated to make sure their blood is safe. This is blood bank blood. Transfusion therapy is the safest it has ever been in the practice of medicine. Before blood is taken from a donor, a complete history is taken to make sure that person has no history of diseases nor engages in risky social behavior (examples are intravenous  drug use or sexual activity with multiple partners). The donor's travel history is screened to minimize risk of transmitting infections, such as malaria. The donated blood is tested for signs of infectious diseases, such as HIV and hepatitis. The blood is then tested to be sure it is compatible with you in order to minimize the chance of a transfusion reaction. If you or a relative donates blood, this is often done in anticipation of surgery and is not appropriate for emergency situations. It takes many days to process the donated blood. RISKS AND COMPLICATIONS Although transfusion therapy is very safe and saves many lives, the main dangers of transfusion include:  Getting an infectious disease.  Developing a transfusion reaction. This is an allergic reaction to something in the blood you were given. Every precaution is taken to prevent this. The decision to have a blood transfusion has been considered carefully by your caregiver before blood is given. Blood is not given unless the benefits outweigh the risks. AFTER THE TRANSFUSION  Right after receiving a blood transfusion, you will usually feel much better and more energetic. This is especially true if your red blood cells have gotten low (anemic). The transfusion raises the level of the red blood cells which carry oxygen, and this usually causes an energy increase.  The nurse administering the transfusion will monitor you carefully for complications. HOME CARE INSTRUCTIONS  No special instructions are needed after a transfusion. You may find your energy is better. Speak with your caregiver about any limitations on activity for underlying diseases you may have. SEEK MEDICAL CARE IF:   Your condition is not improving after your transfusion.  You develop redness or irritation at the intravenous (IV) site. SEEK IMMEDIATE MEDICAL CARE IF:  Any of the following symptoms occur over the next 12 hours:  Shaking chills.  You have a temperature by  mouth above 102 F (38.9 C), not controlled by medicine.  Chest, back, or muscle pain.  People around you feel you are not acting correctly or are confused.  Shortness of breath or difficulty breathing.  Dizziness and fainting.  You get a rash or develop hives.  You have a decrease in urine output.  Your urine turns a dark color or changes to pink, red, or brown. Any of the following symptoms occur over the next 10 days:  You have a temperature by mouth above 102 F (38.9 C), not controlled by medicine.  Shortness of breath.  Weakness after normal activity.  The white part of the eye turns yellow (jaundice).  You have a decrease in the amount of urine or are urinating less often.  Your urine turns a dark color or changes to pink, red, or brown. Document Released: 12/16/1999 Document Revised: 03/12/2011 Document Reviewed: 08/04/2007 [facility] Patient Information 2014 [place], [place].  _______________________________________________________________________

## 2014-08-20 ENCOUNTER — Inpatient Hospital Stay (HOSPITAL_COMMUNITY): Payer: Medicare Other

## 2014-08-20 ENCOUNTER — Inpatient Hospital Stay (HOSPITAL_COMMUNITY)
Admission: RE | Admit: 2014-08-20 | Discharge: 2014-08-23 | DRG: 470 | Disposition: A | Discharge status: Home Health | Payer: Medicare Other | Source: Ambulatory Visit | Attending: Orthopaedic Surgery | Admitting: Orthopaedic Surgery

## 2014-08-20 ENCOUNTER — Encounter (HOSPITAL_COMMUNITY)
Admission: RE | Disposition: A | Discharge status: Home Health | Payer: Self-pay | Source: Ambulatory Visit | Attending: Orthopaedic Surgery

## 2014-08-20 ENCOUNTER — Inpatient Hospital Stay (HOSPITAL_COMMUNITY): Payer: Medicare Other | Admitting: Anesthesiology

## 2014-08-20 ENCOUNTER — Encounter (HOSPITAL_COMMUNITY): Payer: Self-pay | Admitting: *Deleted

## 2014-08-20 DIAGNOSIS — E669 Obesity, unspecified: Secondary | ICD-10-CM | POA: Diagnosis present

## 2014-08-20 DIAGNOSIS — Z96649 Presence of unspecified artificial hip joint: Secondary | ICD-10-CM | POA: Diagnosis present

## 2014-08-20 DIAGNOSIS — Z6830 Body mass index (BMI) 30.0-30.9, adult: Secondary | ICD-10-CM | POA: Diagnosis not present

## 2014-08-20 DIAGNOSIS — Z96642 Presence of left artificial hip joint: Secondary | ICD-10-CM | POA: Diagnosis not present

## 2014-08-20 DIAGNOSIS — D62 Acute posthemorrhagic anemia: Secondary | ICD-10-CM | POA: Diagnosis not present

## 2014-08-20 DIAGNOSIS — M160 Bilateral primary osteoarthritis of hip: Secondary | ICD-10-CM | POA: Diagnosis not present

## 2014-08-20 DIAGNOSIS — E780 Pure hypercholesterolemia: Secondary | ICD-10-CM | POA: Diagnosis present

## 2014-08-20 DIAGNOSIS — Z885 Allergy status to narcotic agent status: Secondary | ICD-10-CM

## 2014-08-20 DIAGNOSIS — G4730 Sleep apnea, unspecified: Secondary | ICD-10-CM | POA: Diagnosis present

## 2014-08-20 DIAGNOSIS — E119 Type 2 diabetes mellitus without complications: Secondary | ICD-10-CM | POA: Diagnosis present

## 2014-08-20 DIAGNOSIS — M549 Dorsalgia, unspecified: Secondary | ICD-10-CM | POA: Diagnosis not present

## 2014-08-20 DIAGNOSIS — Z419 Encounter for procedure for purposes other than remedying health state, unspecified: Secondary | ICD-10-CM

## 2014-08-20 DIAGNOSIS — M1612 Unilateral primary osteoarthritis, left hip: Secondary | ICD-10-CM

## 2014-08-20 DIAGNOSIS — Z01812 Encounter for preprocedural laboratory examination: Secondary | ICD-10-CM

## 2014-08-20 DIAGNOSIS — I10 Essential (primary) hypertension: Secondary | ICD-10-CM | POA: Diagnosis present

## 2014-08-20 DIAGNOSIS — M25752 Osteophyte, left hip: Secondary | ICD-10-CM | POA: Diagnosis present

## 2014-08-20 DIAGNOSIS — M169 Osteoarthritis of hip, unspecified: Secondary | ICD-10-CM | POA: Diagnosis not present

## 2014-08-20 DIAGNOSIS — Z471 Aftercare following joint replacement surgery: Secondary | ICD-10-CM | POA: Diagnosis not present

## 2014-08-20 DIAGNOSIS — M25552 Pain in left hip: Secondary | ICD-10-CM | POA: Diagnosis not present

## 2014-08-20 HISTORY — PX: TOTAL HIP ARTHROPLASTY: SHX124

## 2014-08-20 LAB — GLUCOSE, CAPILLARY
GLUCOSE-CAPILLARY: 245 mg/dL — AB (ref 65–99)
Glucose-Capillary: 118 mg/dL — ABNORMAL HIGH (ref 65–99)
Glucose-Capillary: 124 mg/dL — ABNORMAL HIGH (ref 65–99)

## 2014-08-20 LAB — TYPE AND SCREEN
ABO/RH(D): A POS
ANTIBODY SCREEN: NEGATIVE

## 2014-08-20 SURGERY — ARTHROPLASTY, HIP, TOTAL, ANTERIOR APPROACH
Anesthesia: Spinal | Laterality: Left

## 2014-08-20 MED ORDER — METHOCARBAMOL 500 MG PO TABS
500.0000 mg | ORAL_TABLET | Freq: Four times a day (QID) | ORAL | Status: DC | PRN
  Administered 2014-08-21 – 2014-08-23 (×4): 500 mg via ORAL
  Filled 2014-08-20 (×4): qty 1

## 2014-08-20 MED ORDER — SODIUM CHLORIDE 0.9 % IV SOLN
10.0000 mg | INTRAVENOUS | Status: DC | PRN
  Administered 2014-08-20: 50 ug/min via INTRAVENOUS

## 2014-08-20 MED ORDER — HYDROMORPHONE HCL 1 MG/ML IJ SOLN
INTRAMUSCULAR | Status: AC
  Filled 2014-08-20: qty 1

## 2014-08-20 MED ORDER — SIMVASTATIN 10 MG PO TABS
10.0000 mg | ORAL_TABLET | Freq: Every day | ORAL | Status: DC
  Administered 2014-08-20 – 2014-08-22 (×3): 10 mg via ORAL
  Filled 2014-08-20 (×4): qty 1

## 2014-08-20 MED ORDER — CEFAZOLIN SODIUM 1-5 GM-% IV SOLN
1.0000 g | Freq: Four times a day (QID) | INTRAVENOUS | Status: AC
  Administered 2014-08-20 – 2014-08-21 (×2): 1 g via INTRAVENOUS
  Filled 2014-08-20 (×2): qty 50

## 2014-08-20 MED ORDER — CEFAZOLIN SODIUM-DEXTROSE 2-3 GM-% IV SOLR
INTRAVENOUS | Status: AC
  Filled 2014-08-20: qty 50

## 2014-08-20 MED ORDER — ATENOLOL 25 MG PO TABS
25.0000 mg | ORAL_TABLET | Freq: Every morning | ORAL | Status: DC
  Administered 2014-08-21 – 2014-08-23 (×3): 25 mg via ORAL
  Filled 2014-08-20 (×3): qty 1

## 2014-08-20 MED ORDER — METHOCARBAMOL 1000 MG/10ML IJ SOLN
500.0000 mg | Freq: Four times a day (QID) | INTRAVENOUS | Status: DC | PRN
  Administered 2014-08-20 – 2014-08-21 (×2): 500 mg via INTRAVENOUS
  Filled 2014-08-20 (×4): qty 5

## 2014-08-20 MED ORDER — GLIPIZIDE 10 MG PO TABS
10.0000 mg | ORAL_TABLET | Freq: Two times a day (BID) | ORAL | Status: DC
  Administered 2014-08-21 – 2014-08-23 (×5): 10 mg via ORAL
  Filled 2014-08-20 (×7): qty 1

## 2014-08-20 MED ORDER — HYDROMORPHONE HCL 1 MG/ML IJ SOLN
0.2500 mg | INTRAMUSCULAR | Status: DC | PRN
  Administered 2014-08-20: 0.25 mg via INTRAVENOUS
  Administered 2014-08-20 (×2): 0.5 mg via INTRAVENOUS
  Administered 2014-08-20: 0.25 mg via INTRAVENOUS

## 2014-08-20 MED ORDER — PHENYLEPHRINE HCL 10 MG/ML IJ SOLN
INTRAMUSCULAR | Status: AC
  Filled 2014-08-20: qty 1

## 2014-08-20 MED ORDER — FENTANYL CITRATE (PF) 100 MCG/2ML IJ SOLN
INTRAMUSCULAR | Status: DC | PRN
Start: 2014-08-20 — End: 2014-08-20
  Administered 2014-08-20: 100 ug via INTRAVENOUS

## 2014-08-20 MED ORDER — HYDROMORPHONE HCL 1 MG/ML IJ SOLN
1.0000 mg | INTRAMUSCULAR | Status: DC | PRN
  Administered 2014-08-20 – 2014-08-21 (×3): 1 mg via INTRAVENOUS
  Filled 2014-08-20 (×3): qty 1

## 2014-08-20 MED ORDER — MENTHOL 3 MG MT LOZG: 1.0000 | LOZENGE | OROMUCOSAL | Status: DC | PRN

## 2014-08-20 MED ORDER — INSULIN ASPART 100 UNIT/ML ~~LOC~~ SOLN
0.0000 [IU] | Freq: Every day | SUBCUTANEOUS | Status: DC
  Administered 2014-08-20: 2 [IU] via SUBCUTANEOUS

## 2014-08-20 MED ORDER — SODIUM CHLORIDE 0.9 % IV SOLN
1000.0000 mg | INTRAVENOUS | Status: AC
  Administered 2014-08-20: 1000 mg via INTRAVENOUS
  Filled 2014-08-20: qty 10

## 2014-08-20 MED ORDER — LACTATED RINGERS IV SOLN: INTRAVENOUS | Status: DC

## 2014-08-20 MED ORDER — LACTATED RINGERS IV SOLN
INTRAVENOUS | Status: DC
  Administered 2014-08-20: 17:00:00 via INTRAVENOUS
  Administered 2014-08-20: 1000 mL via INTRAVENOUS

## 2014-08-20 MED ORDER — METOCLOPRAMIDE HCL 10 MG PO TABS: 5.0000 mg | ORAL_TABLET | Freq: Three times a day (TID) | ORAL | Status: DC | PRN

## 2014-08-20 MED ORDER — ACETAMINOPHEN 650 MG RE SUPP: 650.0000 mg | Freq: Four times a day (QID) | RECTAL | Status: DC | PRN

## 2014-08-20 MED ORDER — SODIUM CHLORIDE 0.9 % IV SOLN
INTRAVENOUS | Status: DC
  Administered 2014-08-20: 19:00:00 via INTRAVENOUS

## 2014-08-20 MED ORDER — PHENYLEPHRINE 40 MCG/ML (10ML) SYRINGE FOR IV PUSH (FOR BLOOD PRESSURE SUPPORT)
PREFILLED_SYRINGE | INTRAVENOUS | Status: AC
  Filled 2014-08-20: qty 10

## 2014-08-20 MED ORDER — ONDANSETRON HCL 4 MG/2ML IJ SOLN
INTRAMUSCULAR | Status: DC | PRN
  Administered 2014-08-20: 4 mg via INTRAVENOUS

## 2014-08-20 MED ORDER — MIDAZOLAM HCL 5 MG/5ML IJ SOLN
INTRAMUSCULAR | Status: DC | PRN
  Administered 2014-08-20: 2 mg via INTRAVENOUS

## 2014-08-20 MED ORDER — AMITRIPTYLINE HCL 25 MG PO TABS
25.0000 mg | ORAL_TABLET | Freq: Every day | ORAL | Status: DC
  Administered 2014-08-20 – 2014-08-22 (×3): 25 mg via ORAL
  Filled 2014-08-20 (×5): qty 1

## 2014-08-20 MED ORDER — ONDANSETRON HCL 4 MG PO TABS: 4.0000 mg | ORAL_TABLET | Freq: Four times a day (QID) | ORAL | Status: DC | PRN

## 2014-08-20 MED ORDER — PROPOFOL 10 MG/ML IV BOLUS
INTRAVENOUS | Status: AC
  Filled 2014-08-20: qty 20

## 2014-08-20 MED ORDER — OXYCODONE HCL 5 MG PO TABS
5.0000 mg | ORAL_TABLET | ORAL | Status: DC | PRN
  Administered 2014-08-20: 5 mg via ORAL
  Administered 2014-08-20: 10 mg via ORAL
  Administered 2014-08-20 – 2014-08-21 (×6): 15 mg via ORAL
  Administered 2014-08-22: 10 mg via ORAL
  Administered 2014-08-22 – 2014-08-23 (×6): 15 mg via ORAL
  Filled 2014-08-20 (×5): qty 3
  Filled 2014-08-20: qty 2
  Filled 2014-08-20 (×5): qty 3
  Filled 2014-08-20: qty 2
  Filled 2014-08-20: qty 3
  Filled 2014-08-20: qty 1
  Filled 2014-08-20: qty 3

## 2014-08-20 MED ORDER — DEXAMETHASONE SODIUM PHOSPHATE 10 MG/ML IJ SOLN
INTRAMUSCULAR | Status: DC | PRN
  Administered 2014-08-20: 10 mg via INTRAVENOUS

## 2014-08-20 MED ORDER — ASPIRIN EC 325 MG PO TBEC
325.0000 mg | DELAYED_RELEASE_TABLET | Freq: Two times a day (BID) | ORAL | Status: DC
Start: 2014-08-21 — End: 2014-08-23
  Administered 2014-08-21 – 2014-08-23 (×5): 325 mg via ORAL
  Filled 2014-08-20 (×7): qty 1

## 2014-08-20 MED ORDER — ALUM & MAG HYDROXIDE-SIMETH 200-200-20 MG/5ML PO SUSP: 30.0000 mL | ORAL | Status: DC | PRN

## 2014-08-20 MED ORDER — INSULIN ASPART 100 UNIT/ML ~~LOC~~ SOLN
0.0000 [IU] | Freq: Three times a day (TID) | SUBCUTANEOUS | Status: DC
  Administered 2014-08-21: 5 [IU] via SUBCUTANEOUS
  Administered 2014-08-21: 3 [IU] via SUBCUTANEOUS
  Administered 2014-08-21: 2 [IU] via SUBCUTANEOUS

## 2014-08-20 MED ORDER — METOCLOPRAMIDE HCL 5 MG/ML IJ SOLN: 5.0000 mg | Freq: Three times a day (TID) | INTRAMUSCULAR | Status: DC | PRN

## 2014-08-20 MED ORDER — BUPIVACAINE HCL (PF) 0.5 % IJ SOLN
INTRAMUSCULAR | Status: DC | PRN
  Administered 2014-08-20: 15 mg

## 2014-08-20 MED ORDER — PHENYLEPHRINE HCL 10 MG/ML IJ SOLN
INTRAMUSCULAR | Status: DC | PRN
  Administered 2014-08-20 (×2): 80 ug via INTRAVENOUS

## 2014-08-20 MED ORDER — PHENOL 1.4 % MT LIQD: 1.0000 | OROMUCOSAL | Status: DC | PRN

## 2014-08-20 MED ORDER — PROMETHAZINE HCL 25 MG/ML IJ SOLN: 6.2500 mg | INTRAMUSCULAR | Status: DC | PRN

## 2014-08-20 MED ORDER — METFORMIN HCL 500 MG PO TABS
500.0000 mg | ORAL_TABLET | Freq: Two times a day (BID) | ORAL | Status: DC
  Administered 2014-08-21 – 2014-08-23 (×5): 500 mg via ORAL
  Filled 2014-08-20 (×7): qty 1

## 2014-08-20 MED ORDER — ONDANSETRON HCL 4 MG/2ML IJ SOLN
4.0000 mg | Freq: Four times a day (QID) | INTRAMUSCULAR | Status: DC | PRN
  Administered 2014-08-21: 4 mg via INTRAVENOUS
  Filled 2014-08-20: qty 2

## 2014-08-20 MED ORDER — POLYETHYLENE GLYCOL 3350 17 G PO PACK: 17.0000 g | PACK | Freq: Every day | ORAL | Status: DC | PRN

## 2014-08-20 MED ORDER — MIDAZOLAM HCL 2 MG/2ML IJ SOLN
INTRAMUSCULAR | Status: AC
  Filled 2014-08-20: qty 4

## 2014-08-20 MED ORDER — CEFAZOLIN SODIUM-DEXTROSE 2-3 GM-% IV SOLR
2.0000 g | INTRAVENOUS | Status: AC
  Administered 2014-08-20: 2 g via INTRAVENOUS

## 2014-08-20 MED ORDER — SODIUM CHLORIDE 0.9 % IR SOLN
Status: DC | PRN
Start: 2014-08-20 — End: 2014-08-20
  Administered 2014-08-20: 1000 mL

## 2014-08-20 MED ORDER — ACETAMINOPHEN 325 MG PO TABS
650.0000 mg | ORAL_TABLET | Freq: Four times a day (QID) | ORAL | Status: DC | PRN
  Administered 2014-08-20 – 2014-08-22 (×3): 650 mg via ORAL
  Filled 2014-08-20 (×3): qty 2

## 2014-08-20 MED ORDER — BUPIVACAINE HCL (PF) 0.5 % IJ SOLN
INTRAMUSCULAR | Status: AC
  Filled 2014-08-20: qty 30

## 2014-08-20 MED ORDER — ZOLPIDEM TARTRATE 5 MG PO TABS: 5.0000 mg | ORAL_TABLET | Freq: Every evening | ORAL | Status: DC | PRN

## 2014-08-20 MED ORDER — EPHEDRINE SULFATE 50 MG/ML IJ SOLN
INTRAMUSCULAR | Status: AC
  Filled 2014-08-20: qty 1

## 2014-08-20 MED ORDER — SODIUM CHLORIDE 0.9 % IJ SOLN
INTRAMUSCULAR | Status: AC
  Filled 2014-08-20: qty 10

## 2014-08-20 MED ORDER — DOCUSATE SODIUM 100 MG PO CAPS
100.0000 mg | ORAL_CAPSULE | Freq: Two times a day (BID) | ORAL | Status: DC
  Administered 2014-08-20 – 2014-08-23 (×6): 100 mg via ORAL

## 2014-08-20 MED ORDER — PROPOFOL INFUSION 10 MG/ML OPTIME
INTRAVENOUS | Status: DC | PRN
  Administered 2014-08-20: 100 ug/kg/min via INTRAVENOUS

## 2014-08-20 MED ORDER — FENTANYL CITRATE (PF) 100 MCG/2ML IJ SOLN
INTRAMUSCULAR | Status: AC
  Filled 2014-08-20: qty 4

## 2014-08-20 MED ORDER — DIPHENHYDRAMINE HCL 12.5 MG/5ML PO ELIX
12.5000 mg | ORAL_SOLUTION | ORAL | Status: DC | PRN
  Administered 2014-08-21 – 2014-08-22 (×2): 12.5 mg via ORAL
  Filled 2014-08-20: qty 10
  Filled 2014-08-20: qty 5

## 2014-08-20 MED ORDER — EPHEDRINE SULFATE 50 MG/ML IJ SOLN
INTRAMUSCULAR | Status: DC | PRN
  Administered 2014-08-20 (×3): 10 mg via INTRAVENOUS

## 2014-08-20 SURGICAL SUPPLY — 42 items
BAG ZIPLOCK 12X15 (MISCELLANEOUS) IMPLANT
BENZOIN TINCTURE PRP APPL 2/3 (GAUZE/BANDAGES/DRESSINGS) ×3 IMPLANT
BLADE SAW SGTL 18X1.27X75 (BLADE) ×2 IMPLANT
BLADE SAW SGTL 18X1.27X75MM (BLADE) ×1
CAPT HIP TOTAL 2 ×3 IMPLANT
CELLS DAT CNTRL 66122 CELL SVR (MISCELLANEOUS) ×1 IMPLANT
CLOSURE WOUND 1/2 X4 (GAUZE/BANDAGES/DRESSINGS) ×1
COVER PERINEAL POST (MISCELLANEOUS) ×3 IMPLANT
DRAPE C-ARM 42X120 X-RAY (DRAPES) ×3 IMPLANT
DRAPE STERI IOBAN 125X83 (DRAPES) ×3 IMPLANT
DRAPE U-SHAPE 47X51 STRL (DRAPES) ×9 IMPLANT
DRSG AQUACEL AG ADV 3.5X10 (GAUZE/BANDAGES/DRESSINGS) ×3 IMPLANT
DURAPREP 26ML APPLICATOR (WOUND CARE) ×3 IMPLANT
ELECT BLADE TIP CTD 4 INCH (ELECTRODE) ×3 IMPLANT
ELECT REM PT RETURN 9FT ADLT (ELECTROSURGICAL) ×3
ELECTRODE REM PT RTRN 9FT ADLT (ELECTROSURGICAL) ×1 IMPLANT
FACESHIELD WRAPAROUND (MASK) ×12 IMPLANT
GAUZE XEROFORM 1X8 LF (GAUZE/BANDAGES/DRESSINGS) IMPLANT
GLOVE BIO SURGEON STRL SZ7.5 (GLOVE) ×3 IMPLANT
GLOVE BIOGEL PI IND STRL 8 (GLOVE) ×2 IMPLANT
GLOVE BIOGEL PI INDICATOR 8 (GLOVE) ×4
GLOVE ECLIPSE 8.0 STRL XLNG CF (GLOVE) ×3 IMPLANT
GOWN STRL REUS W/TWL XL LVL3 (GOWN DISPOSABLE) ×6 IMPLANT
HANDPIECE INTERPULSE COAX TIP (DISPOSABLE) ×2
KIT BASIN OR (CUSTOM PROCEDURE TRAY) ×3 IMPLANT
PACK TOTAL JOINT (CUSTOM PROCEDURE TRAY) ×3 IMPLANT
PEN SKIN MARKING BROAD (MISCELLANEOUS) ×3 IMPLANT
RTRCTR WOUND ALEXIS 18CM MED (MISCELLANEOUS) ×3
SET HNDPC FAN SPRY TIP SCT (DISPOSABLE) ×1 IMPLANT
STAPLER VISISTAT 35W (STAPLE) IMPLANT
STRIP CLOSURE SKIN 1/2X4 (GAUZE/BANDAGES/DRESSINGS) ×2 IMPLANT
SUT ETHIBOND NAB CT1 #1 30IN (SUTURE) ×3 IMPLANT
SUT MNCRL AB 4-0 PS2 18 (SUTURE) IMPLANT
SUT VIC AB 0 CT1 36 (SUTURE) ×3 IMPLANT
SUT VIC AB 1 CT1 36 (SUTURE) ×3 IMPLANT
SUT VIC AB 2-0 CT1 27 (SUTURE) ×4
SUT VIC AB 2-0 CT1 TAPERPNT 27 (SUTURE) ×2 IMPLANT
TOWEL OR 17X26 10 PK STRL BLUE (TOWEL DISPOSABLE) ×3 IMPLANT
TOWEL OR NON WOVEN STRL DISP B (DISPOSABLE) ×3 IMPLANT
TRAY FOLEY W/METER SILVER 14FR (SET/KITS/TRAYS/PACK) ×3 IMPLANT
TRAY FOLEY W/METER SILVER 16FR (SET/KITS/TRAYS/PACK) ×3 IMPLANT
YANKAUER SUCT BULB TIP 10FT TU (MISCELLANEOUS) ×3 IMPLANT

## 2014-08-20 NOTE — Progress Notes (Signed)
RT placed pt on auto titrate CPAP set at 4-6cmH2O per home settings with 2Lpm of oxygen bled in via home ffm. Pt states he is comfortable and is tolerating CPAP well at this time. RT will continue to monitor as needed.

## 2014-08-20 NOTE — Anesthesia Preprocedure Evaluation (Addendum)
Anesthesia Evaluation  Patient identified by MRN, date of birth, ID band Patient awake    Reviewed: Allergy & Precautions, NPO status , Patient's Chart, lab work & pertinent test results  History of Anesthesia Complications (+) Family history of anesthesia reaction  Airway Mallampati: II  TM Distance: >3 FB Neck ROM: Full    Dental no notable dental hx.    Pulmonary sleep apnea ,  breath sounds clear to auscultation  Pulmonary exam normal       Cardiovascular Exercise Tolerance: Good hypertension, Pt. on medications and Pt. on home beta blockers Normal cardiovascular examRhythm:Regular Rate:Normal     Neuro/Psych negative neurological ROS  negative psych ROS   GI/Hepatic negative GI ROS, Neg liver ROS,   Endo/Other  diabetes, Type 2, Oral Hypoglycemic Agents  Renal/GU negative Renal ROS  negative genitourinary   Musculoskeletal  (+) Arthritis -,   Abdominal (+) + obese,   Peds negative pediatric ROS (+)  Hematology negative hematology ROS (+)   Anesthesia Other Findings   Reproductive/Obstetrics negative OB ROS                            Anesthesia Physical Anesthesia Plan  ASA: III  Anesthesia Plan: Spinal   Post-op Pain Management:    Induction: Intravenous  Airway Management Planned: Natural Airway  Additional Equipment:   Intra-op Plan:   Post-operative Plan:   Informed Consent: I have reviewed the patients History and Physical, chart, labs and discussed the procedure including the risks, benefits and alternatives for the proposed anesthesia with the patient or authorized representative who has indicated his/her understanding and acceptance.   Dental advisory given  Plan Discussed with: CRNA  Anesthesia Plan Comments: (Discussed risks and benefits of and differences between spinal and general. Discussed risks of spinal including headache, backache, failure, bleeding  and hematoma, infection, and nerve damage and paralysis. Patient consents to spinal. Questions answered. Coagulation studies and platelet count acceptable.   Did fine with spinal anesthesia in June.)       Anesthesia Quick Evaluation

## 2014-08-20 NOTE — Transfer of Care (Signed)
Immediate Anesthesia Transfer of Care Note  Patient: Eric Wood  Procedure(s) Performed: Procedure(s): LEFT TOTAL HIP ARTHROPLASTY ANTERIOR APPROACH (Left)  Patient Location: PACU  Anesthesia Type:MAC  Level of Consciousness:  sedated, patient cooperative and responds to stimulation  Airway & Oxygen Therapy:Patient Spontanous Breathing and Patient connected to face mask oxgen  Post-op Assessment:  Report given to PACU RN and Post -op Vital signs reviewed and stable  Post vital signs:  Reviewed and stable  Last Vitals:  Filed Vitals:   08/20/14 1230  BP: 133/88  Pulse: 63  Temp: 36.6 C  Resp: 18    Complications: No apparent anesthesia complications

## 2014-08-20 NOTE — Anesthesia Procedure Notes (Addendum)
Spinal  Start time: 08/20/2014 4:03 PM End time: 08/20/2014 4:06 PM Staffing Resident/CRNA: Harle Stanford R Performed by: resident/CRNA  Preanesthetic Checklist Completed: patient identified, site marked, surgical consent, pre-op evaluation, timeout performed, IV checked, risks and benefits discussed and monitors and equipment checked Spinal Block Patient position: sitting Prep: Betadine Patient monitoring: heart rate, cardiac monitor, continuous pulse ox and blood pressure Approach: midline Location: L3-4 Needle Needle type: Spinocan  Needle gauge: 22 G Needle length: 9 cm Needle insertion depth: 7 cm Additional Notes Spinal kit date checked. Timeout for anticoagulant and labs wnl. SAB without difficulty  Performed by: Maxwell Caul Oxygen Delivery Method: Simple face mask Placement Confirmation: CO2 detector

## 2014-08-20 NOTE — Brief Op Note (Signed)
08/20/2014  5:23 PM  PATIENT:  Eric Wood  59 y.o. male  PRE-OPERATIVE DIAGNOSIS:  severe osteoarthritis left hip  POST-OPERATIVE DIAGNOSIS:  severe osteoarthritis left hip  PROCEDURE:  Procedure(s): LEFT TOTAL HIP ARTHROPLASTY ANTERIOR APPROACH (Left)  SURGEON:  Surgeon(s) and Role:    * Mcarthur Rossetti, MD - Primary  PHYSICIAN ASSISTANT: Benita Stabile, PA-C  ANESTHESIA:   spinal  EBL:  Total I/O In: 1000 [I.V.:1000] Out: 200 [Blood:200]  BLOOD ADMINISTERED:none  DRAINS: none   LOCAL MEDICATIONS USED:  NONE  SPECIMEN:  No Specimen  DISPOSITION OF SPECIMEN:  N/A  COUNTS:  YES  TOURNIQUET:  * No tourniquets in log *  DICTATION: .Other Dictation: Dictation Number [id number]  PLAN OF CARE: Admit to inpatient   PATIENT DISPOSITION:  PACU - hemodynamically stable.   Delay start of Pharmacological VTE agent (>24hrs) due to surgical blood loss or risk of bleeding: no

## 2014-08-20 NOTE — Progress Notes (Signed)
   08/20/14 1915  PACU Vital Signs  Temp 97.8 F (36.6 C)  Temperature Source Oral  Pulse Rate 73  ECG Heart Rate 73  Resp 15  BP 137/77 mmHg  Oxygen Therapy  SpO2 100 %  O2 Device Nasal Cannula  O2 Flow Rate (L/min) 2 L/min  pt on sleep apnea protocol. no apenic episodes, pt awake and alert while in pacu. Denenny MD at bedside, ok to transfer pt out of pacu to floor.

## 2014-08-20 NOTE — H&P (Signed)
TOTAL HIP ADMISSION H&P  Patient is admitted for left total hip arthroplasty.  Subjective:  Chief Complaint: left hip pain  HPI: Eric Wood, 59 y.o. male, has a history of pain and functional disability in the left hip(s) due to arthritis and patient has failed non-surgical conservative treatments for greater than 12 weeks to include NSAID's and/or analgesics, corticosteriod injections, flexibility and strengthening excercises, supervised PT with diminished ADL's post treatment, use of assistive devices, weight reduction as appropriate and activity modification.  Onset of symptoms was gradual starting 5 years ago with gradually worsening course since that time.The patient noted no past surgery on the left hip(s).  Patient currently rates pain in the left hip at 10 out of 10 with activity. Patient has night pain, worsening of pain with activity and weight bearing, trendelenberg gait, pain that interfers with activities of daily living, pain with passive range of motion and crepitus. Patient has evidence of subchondral cysts, subchondral sclerosis, periarticular osteophytes and joint space narrowing by imaging studies. This condition presents safety issues increasing the risk of falls.  There is no current active infection.  Patient Active Problem List   Diagnosis Date Noted  . Osteoarthritis of left hip 08/20/2014  . Osteoarthritis of right hip 06/11/2014  . Status post total replacement of right hip 06/11/2014   Past Medical History  Diagnosis Date  . Hypertension   . Hypercholesteremia     under control  . Diabetes mellitus     type 2  . Arthritis   . Back pain     acupuncture  . Family history of adverse reaction to anesthesia     Severe PONV  . Sleep apnea     sometimes wear CPAP     Past Surgical History  Procedure Laterality Date  . No past surgeries    . Colonoscopy w/ polypectomy    . Acupuncture      For back and hip  . Total hip arthroplasty Right 06/11/2014   Procedure: RIGHT TOTAL HIP ARTHROPLASTY ANTERIOR APPROACH;  Surgeon: Mcarthur Rossetti, MD;  Location: [facility];  Service: Orthopedics;  Laterality: Right;    No prescriptions prior to admission   Allergies  Allergen Reactions  . Tramadol Itching    Hoarse voice, slow to wake up     Social History  Substance Use Topics  . Smoking status: Never Smoker   . Smokeless tobacco: Never Used  . Alcohol Use: Yes     Comment: occ beer     No family history on file.   Review of Systems  Musculoskeletal: Positive for joint pain.  All other systems reviewed and are negative.   Objective:  Physical Exam  Constitutional: He is oriented to person, place, and time. He appears well-developed and well-nourished.  HENT:  Head: Normocephalic and atraumatic.  Eyes: EOM are normal. Pupils are equal, round, and reactive to light.  Neck: Normal range of motion. Neck supple.  Cardiovascular: Normal rate and regular rhythm.   Respiratory: Effort normal and breath sounds normal.  GI: Soft. Bowel sounds are normal.  Musculoskeletal:       Left hip: He exhibits decreased range of motion, decreased strength, tenderness and bony tenderness.  Neurological: He is alert and oriented to person, place, and time.  Skin: Skin is warm and dry.  Psychiatric: He has a normal mood and affect.    Vital signs in last 24 hours:    Labs:   Estimated body mass index is 29.56 kg/(m^2) as calculated from  the following:   Height as of 06/11/14: 5\' 10"  (1.778 m).   Weight as of 06/07/14: 93.441 kg (206 lb).   Imaging Review Plain radiographs demonstrate severe degenerative joint disease of the left hip(s). The bone quality appears to be good for age and reported activity level.  Assessment/Plan:  End stage arthritis, left hip(s)  The patient history, physical examination, clinical judgement of the provider and imaging studies are consistent with end stage degenerative joint disease of the left hip(s) and  total hip arthroplasty is deemed medically necessary. The treatment options including medical management, injection therapy, arthroscopy and arthroplasty were discussed at length. The risks and benefits of total hip arthroplasty were presented and reviewed. The risks due to aseptic loosening, infection, stiffness, dislocation/subluxation,  thromboembolic complications and other imponderables were discussed.  The patient acknowledged the explanation, agreed to proceed with the plan and consent was signed. Patient is being admitted for inpatient treatment for surgery, pain control, PT, OT, prophylactic antibiotics, VTE prophylaxis, progressive ambulation and ADL's and discharge planning.The patient is planning to be discharged home with home health services

## 2014-08-21 LAB — BASIC METABOLIC PANEL
Anion gap: 7 (ref 5–15)
BUN: 12 mg/dL (ref 6–20)
CHLORIDE: 103 mmol/L (ref 101–111)
CO2: 23 mmol/L (ref 22–32)
Calcium: 8.3 mg/dL — ABNORMAL LOW (ref 8.9–10.3)
Creatinine, Ser: 1.02 mg/dL (ref 0.61–1.24)
GFR calc Af Amer: >60 mL/min (ref >60)
GFR calc non Af Amer: >60 mL/min (ref >60)
Glucose, Bld: 293 mg/dL — ABNORMAL HIGH (ref 65–99)
POTASSIUM: 4.2 mmol/L (ref 3.5–5.1)
SODIUM: 133 mmol/L — AB (ref 135–145)

## 2014-08-21 LAB — CBC
HEMATOCRIT: 32.7 % — AB (ref 39.0–52.0)
HEMOGLOBIN: 10.5 g/dL — AB (ref 13.0–17.0)
MCH: 28.7 pg (ref 26.0–34.0)
MCHC: 32.1 g/dL (ref 30.0–36.0)
MCV: 89.3 fL (ref 78.0–100.0)
Platelets: 332 10*3/uL (ref 150–400)
RBC: 3.66 MIL/uL — AB (ref 4.22–5.81)
RDW: 13.5 % (ref 11.5–15.5)
WBC: 7.7 10*3/uL (ref 4.0–10.5)

## 2014-08-21 LAB — GLUCOSE, CAPILLARY
GLUCOSE-CAPILLARY: 240 mg/dL — AB (ref 65–99)
GLUCOSE-CAPILLARY: 147 mg/dL — AB (ref 65–99)
Glucose-Capillary: 197 mg/dL — ABNORMAL HIGH (ref 65–99)
Glucose-Capillary: 182 mg/dL — ABNORMAL HIGH (ref 65–99)

## 2014-08-21 NOTE — Anesthesia Postprocedure Evaluation (Signed)
  Anesthesia Post-op Note  Patient: Eric Wood  Procedure(s) Performed: Procedure(s) (LRB): LEFT TOTAL HIP ARTHROPLASTY ANTERIOR APPROACH (Left)  Patient Location: PACU  Anesthesia Type: Spinal  Level of Consciousness: awake and alert   Airway and Oxygen Therapy: Patient Spontanous Breathing  Post-op Pain: mild  Post-op Assessment: Post-op Vital signs reviewed, Patient's Cardiovascular Status Stable, Respiratory Function Stable, Patent Airway and No signs of Nausea or vomiting  Last Vitals:  Filed Vitals:   08/21/14 0456  BP: 162/84  Pulse: 68  Temp: 36.4 C  Resp: 16    Post-op Vital Signs: stable   Complications: No apparent anesthesia complications

## 2014-08-21 NOTE — Progress Notes (Signed)
Physical Therapy Treatment Patient Details Name: Eric Wood MRN: [id number] DOB: 04-27-1955 Today's Date: Aug 24, 2014    History of Present Illness R THR; s/p L THR 6/16    PT Comments    Pt motivated and progressing well with mobility.  Follow Up Recommendations  Home health PT     Equipment Recommendations  None recommended by PT    Recommendations for Other Services OT consult     Precautions / Restrictions Precautions Precautions: Fall Restrictions Weight Bearing Restrictions: No Other Position/Activity Restrictions: WBAT    Mobility  Bed Mobility Overal bed mobility: Needs Assistance Bed Mobility: Sit to Supine       Sit to supine: Min assist   General bed mobility comments: cues for sequence and use of R LE to self assist  Transfers Overall transfer level: Needs assistance Equipment used: Rolling walker (2 wheeled) Transfers: Sit to/from Stand Sit to Stand: Min guard         General transfer comment: for safety  Ambulation/Gait Ambulation/Gait assistance: Min assist;Min guard Ambulation Distance (Feet): 250 Feet Assistive device: Rolling walker (2 wheeled) Gait Pattern/deviations: Step-to pattern;Step-through pattern;Decreased step length - right;Decreased step length - left;Shuffle;Trunk flexed     General Gait Details: cues for posture, position from RW and initial sequence   Stairs            Wheelchair Mobility    Modified Rankin (Stroke Patients Only)       Balance                                    Cognition Arousal/Alertness: Awake/alert Behavior During Therapy: WFL for tasks assessed/performed Overall Cognitive Status: Within Functional Limits for tasks assessed                      Exercises Total Joint Exercises Ankle Circles/Pumps: AROM;Both;15 reps;Supine Quad Sets: AROM;Both;10 reps;Supine Heel Slides: AAROM;Left;20 reps;Supine Hip ABduction/ADduction: AAROM;Left;15 reps;Supine     General Comments        Pertinent Vitals/Pain Pain Assessment: 0-10 Pain Score: 2  Pain Location: R hip Pain Descriptors / Indicators: Aching;Burning Pain Intervention(s): Limited activity within patient's tolerance;Monitored during session;Premedicated before session;Ice applied    Home Living Family/patient expects to be discharged to:: Private residence Living Arrangements: Other relatives Available Help at Discharge: Family         Home Equipment: Bedside commode      Prior Function Level of Independence: Independent;Independent with assistive device(s)          PT Goals (current goals can now be found in the care plan section) Acute Rehab PT Goals Patient Stated Goal: Resume previous lifestyle with decreased pain PT Goal Formulation: With patient Time For Goal Achievement: 08/26/14 Potential to Achieve Goals: Good Progress towards PT goals: Progressing toward goals    Frequency  7X/week    PT Plan Current plan remains appropriate    Co-evaluation             End of Session Equipment Utilized During Treatment: Gait belt Activity Tolerance: Patient tolerated treatment well Patient left: in bed;with call bell/phone within reach     Time: 5638-9373 PT Time Calculation (min) (ACUTE ONLY): 30 min  Charges:  $Gait Training: 8-22 mins $Therapeutic Exercise: 8-22 mins                    G Codes:      Mizani Dilday 08/24/2014, 4:28  PM   

## 2014-08-21 NOTE — Progress Notes (Signed)
Subjective: 1 Day Post-Op Procedure(s) (LRB): LEFT TOTAL HIP ARTHROPLASTY ANTERIOR APPROACH (Left) Patient reports pain as moderate.  Asymptomatic acute blood loss anemia.  Objective: Vital signs in last 24 hours: Temp:  [97.5 F (36.4 C)-98.8 F (37.1 C)] 97.5 F (36.4 C) (08/20 0856) Pulse Rate:  [63-85] 68 (08/20 0856) Resp:  [15-22] 18 (08/20 0856) BP: (90-162)/(48-90) 155/80 mmHg (08/20 0856) SpO2:  [95 %-100 %] 95 % (08/20 0856) Weight:  [95.709 kg (211 lb)] 95.709 kg (211 lb) (08/19 1230)  Intake/Output from previous day: 08/19 0701 - 08/20 0700 In: 3457.5 [P.O.:480; I.V.:2812.5; IV Piggyback:165] Out: 875 [Urine:675; Blood:200] Intake/Output this shift: Total I/O In: 240 [P.O.:240] Out: 500 [Urine:500]   Recent Labs  08/21/14 0420  HGB 10.5*    Recent Labs  08/21/14 0420  WBC 7.7  RBC 3.66*  HCT 32.7*  PLT 332    Recent Labs  08/21/14 0420  NA 133*  K 4.2  CL 103  CO2 23  BUN 12  CREATININE 1.02  GLUCOSE 293*  CALCIUM 8.3*   No results for input(s): LABPT, INR in the last 72 hours.  Sensation intact distally Intact pulses distally Dorsiflexion/Plantar flexion intact Incision: scant drainage  Assessment/Plan: 1 Day Post-Op Procedure(s) (LRB): LEFT TOTAL HIP ARTHROPLASTY ANTERIOR APPROACH (Left) Up with therapy  Eric Wood Y 08/21/2014, 10:05 AM

## 2014-08-21 NOTE — Care Management Note (Signed)
Case Management Note  Patient Details  Name: Chibueze Beasley MRN: [id number] Date of Birth: [birth year]  Subjective/Objective:                  Total Hip Replacement  Action/Plan:  Home health services  Expected Discharge Date:   08/23/14               Expected Discharge Plan:  [place]  In-House Referral:  NA  Discharge planning Services  CM Consult  Post Acute Care Choice:  Home Health Choice offered to:  Patient  DME Arranged:  N/A DME Agency:  NA  HH Arranged:  PT HH Agency:  [facility]  Status of Service:  Completed, signed off  Medicare Important Message Given:    Date Medicare IM Given:    Medicare IM give by:    Date Additional Medicare IM Given:    Additional Medicare Important Message give by:     If discussed at [place] of Stay Meetings, dates discussed:    Additional Comments: CM spoke with patient. He has a RW and 3N1 at home from a previous surgery. HHPT arranged pre-operatively with [facility]. Patient agrees.  Apolonio Schneiders, RN 08/21/2014, 3:05 PM

## 2014-08-21 NOTE — Evaluation (Signed)
Occupational Therapy Evaluation Patient Details Name: Eric Wood MRN: [id number] DOB: March 03, 1955 Today's Date: 08/21/2014    History of Present Illness R THR; s/p L THR 6/16   Clinical Impression   This 59 year old man was admitted for the above surgery. He had other hip done in June of this year.  Wanted to review education for ADLs and bathroom transfers.  Pt verbalizes understanding.  No further OT is needed at this time    Follow Up Recommendations  No OT follow up    Equipment Recommendations  None recommended by OT    Recommendations for Other Services       Precautions / Restrictions Precautions Precautions: Fall Restrictions Other Position/Activity Restrictions: WBAT      Mobility Bed Mobility               General bed mobility comments: oob  Transfers   Equipment used: Rolling walker (2 wheeled) Transfers: Sit to/from Stand Sit to Stand: Min guard         General transfer comment: for safety    Balance                                            ADL Overall ADL's : Needs assistance/impaired     Grooming: Oral care;Wash/dry face;Supervision/safety;Standing   Upper Body Bathing: Supervision/ safety;Standing   Lower Body Bathing: Minimal assistance;Sit to/from stand   Upper Body Dressing : Supervision/safety;Standing       Toilet Transfer: Min guard;Ambulation;BSC;RW             General ADL Comments: pt wanted to review ADLs/bathroom transfers.  Stood at sink for most of ADLs.  Cues given to keep walker positioned in front of him for safety.  Also stood at toilet and urinated.  Pt has a tub; he sponge bathed initially last sx and will do so initially     Vision     Perception     Praxis      Pertinent Vitals/Pain Pain Score: 2  Pain Location: R hip Pain Descriptors / Indicators: Sore Pain Intervention(s): Limited activity within patient's tolerance;Monitored during session;Repositioned     Hand  Dominance     Extremity/Trunk Assessment Upper Extremity Assessment Upper Extremity Assessment: Overall WFL for tasks assessed           Communication Communication Communication: No difficulties   Cognition Arousal/Alertness: Awake/alert Behavior During Therapy: WFL for tasks assessed/performed Overall Cognitive Status: Within Functional Limits for tasks assessed                     General Comments       Exercises       Shoulder Instructions      Home Living Family/patient expects to be discharged to:: Private residence Living Arrangements: Other relatives Available Help at Discharge: Family               Bathroom Shower/Tub: Tub/shower unit Shower/tub characteristics: Architectural technologist: Standard     Home Equipment: Bedside commode          Prior Functioning/Environment Level of Independence: Independent;Independent with assistive device(s)             OT Diagnosis: Generalized weakness   OT Problem List:     OT Treatment/Interventions:      OT Goals(Current goals can be found in the care plan section)  OT Frequency:     Barriers to D/C:            Co-evaluation              End of Session    Activity Tolerance: Patient tolerated treatment well Patient left: in chair;with call bell/phone within reach   Time: 1259-1322 OT Time Calculation (min): 23 min Charges:  OT General Charges $OT Visit: 1 Procedure OT Evaluation $Initial OT Evaluation Tier I: 1 Procedure G-Codes:    Khailee Mick 2014-09-09, 2:09 PM  Lesle Chris, OTR/L [phone] 09/09/14

## 2014-08-21 NOTE — Evaluation (Signed)
Physical Therapy Evaluation Patient Details Name: Eric Wood MRN: [id number] DOB: [birth year] Today's Date: 08/21/2014   History of Present Illness  R THR; s/p L THR 6/16  Clinical Impression  Pt s/p R THR presents with decreased R LE strength/ROM and post op pain limiting functional mobility.  Pt should progress well to dc home with family assist and HHPT follow up.   Follow Up Recommendations Home health PT    Equipment Recommendations  None recommended by PT    Recommendations for Other Services OT consult     Precautions / Restrictions Precautions Precautions: Fall Restrictions Weight Bearing Restrictions: No Other Position/Activity Restrictions: WBAT      Mobility  Bed Mobility Overal bed mobility: Needs Assistance Bed Mobility: Supine to Sit     Supine to sit: Min assist     General bed mobility comments: cues for sequence and use of L LE to self assist  Transfers Overall transfer level: Needs assistance Equipment used: Rolling walker (2 wheeled) Transfers: Sit to/from Stand Sit to Stand: Min assist         General transfer comment: cues for LE management and use of UEs to self assist  Ambulation/Gait Ambulation/Gait assistance: Min assist Ambulation Distance (Feet): 200 Feet Assistive device: Rolling walker (2 wheeled) Gait Pattern/deviations: Step-to pattern;Step-through pattern;Decreased step length - right;Decreased step length - left;Shuffle;Trunk flexed     General Gait Details: cues for posture, position from RW and initial sequence  Stairs            Wheelchair Mobility    Modified Rankin (Stroke Patients Only)       Balance                                             Pertinent Vitals/Pain Pain Assessment: 0-10 Pain Score: 3  Pain Location: R hip/thigh Pain Descriptors / Indicators: Aching;Sore Pain Intervention(s): Limited activity within patient's tolerance;Monitored during session;Premedicated before  session;Ice applied    Home Living Family/patient expects to be discharged to:: Private residence Living Arrangements: Other relatives Available Help at Discharge: Family Type of Home: House Home Access: Stairs to enter Entrance Stairs-Rails: Right Entrance Stairs-Number of Steps: [place]: One level Home Equipment: Environmental consultant - 2 wheels;Cane - single point      Prior Function Level of Independence: Independent;Independent with assistive device(s)               Hand Dominance        Extremity/Trunk Assessment   Upper Extremity Assessment: Overall WFL for tasks assessed           Lower Extremity Assessment: RLE deficits/detail      Cervical / Trunk Assessment: Normal  Communication   Communication: Expressive difficulties  Cognition Arousal/Alertness: Awake/alert Behavior During Therapy: WFL for tasks assessed/performed Overall Cognitive Status: Within Functional Limits for tasks assessed                      General Comments      Exercises        Assessment/Plan    PT Assessment Patient needs continued PT services  PT Diagnosis Difficulty walking   PT Problem List Decreased strength;Decreased range of motion;Decreased activity tolerance;Decreased mobility;Decreased knowledge of use of DME;Pain  PT Treatment Interventions DME instruction;Gait training;Stair training;Functional mobility training;Therapeutic activities;Therapeutic exercise;Patient/family education   PT Goals (Current goals can be found in the  Care Plan section) Acute Rehab PT Goals Patient Stated Goal: Resume previous lifestyle with decreased pain PT Goal Formulation: With patient Time For Goal Achievement: 08/26/14 Potential to Achieve Goals: Good    Frequency 7X/week   Barriers to discharge        Co-evaluation               End of Session Equipment Utilized During Treatment: Gait belt Activity Tolerance: Patient tolerated treatment well Patient left: in  chair;with call bell/phone within reach Nurse Communication: Mobility status         Time: 2482-5003 PT Time Calculation (min) (ACUTE ONLY): 25 min   Charges:   PT Evaluation $Initial PT Evaluation Tier I: 1 Procedure PT Treatments $Gait Training: 8-22 mins   PT G Codes:        Eric Wood 09-04-2014, 12:31 PM

## 2014-08-22 LAB — CBC
HCT: 28.9 % — ABNORMAL LOW (ref 39.0–52.0)
Hemoglobin: 9.5 g/dL — ABNORMAL LOW (ref 13.0–17.0)
MCH: 29.7 pg (ref 26.0–34.0)
MCHC: 32.9 g/dL (ref 30.0–36.0)
MCV: 90.3 fL (ref 78.0–100.0)
PLATELETS: 315 10*3/uL (ref 150–400)
RBC: 3.2 MIL/uL — ABNORMAL LOW (ref 4.22–5.81)
RDW: 13.7 % (ref 11.5–15.5)
WBC: 6.5 10*3/uL (ref 4.0–10.5)

## 2014-08-22 LAB — GLUCOSE, CAPILLARY
Glucose-Capillary: 104 mg/dL — ABNORMAL HIGH (ref 65–99)
Glucose-Capillary: 133 mg/dL — ABNORMAL HIGH (ref 65–99)
Glucose-Capillary: 97 mg/dL (ref 65–99)
Glucose-Capillary: 143 mg/dL — ABNORMAL HIGH (ref 65–99)

## 2014-08-22 MED ORDER — OXYCODONE-ACETAMINOPHEN 5-325 MG PO TABS: 1.0000 | ORAL_TABLET | ORAL | Status: DC | PRN

## 2014-08-22 MED ORDER — METHOCARBAMOL 500 MG PO TABS: 500.0000 mg | ORAL_TABLET | Freq: Four times a day (QID) | ORAL | Status: DC | PRN

## 2014-08-22 NOTE — Progress Notes (Signed)
Physical Therapy Treatment Patient Details Name: Eric Wood MRN: [id number] DOB: 10/12/1955 Today's Date: 09/17/2014    History of Present Illness R THR; s/p L THR 6/16    PT Comments    Progressing well and looking forward to dc tomorrow.  Follow Up Recommendations  Home health PT     Equipment Recommendations  None recommended by PT    Recommendations for Other Services OT consult     Precautions / Restrictions Precautions Precautions: Fall Restrictions Weight Bearing Restrictions: No Other Position/Activity Restrictions: WBAT    Mobility  Bed Mobility Overal bed mobility: Needs Assistance Bed Mobility: Supine to Sit;Sit to Supine     Supine to sit: Min guard Sit to supine: Min guard   General bed mobility comments: cues for sequence and use of R LE to self assist  Transfers Overall transfer level: Needs assistance Equipment used: Rolling walker (2 wheeled) Transfers: Sit to/from Stand Sit to Stand: Supervision         General transfer comment: for safety  Ambulation/Gait Ambulation/Gait assistance: Min guard;Supervision Ambulation Distance (Feet): 450 Feet Assistive device: Rolling walker (2 wheeled) Gait Pattern/deviations: Step-to pattern;Step-through pattern;Decreased step length - right;Decreased step length - left;Shuffle;Trunk flexed     General Gait Details: cues for posture, position from RW, ER on L and initial sequence   Stairs Stairs: Yes Stairs assistance: Min guard Stair Management: One rail Right;Step to pattern;Forwards;With cane Number of Stairs: 4 General stair comments:  (min cues for sequence and foot/crutch placement)  Wheelchair Mobility    Modified Rankin (Stroke Patients Only)       Balance                                    Cognition Arousal/Alertness: Awake/alert Behavior During Therapy: WFL for tasks assessed/performed Overall Cognitive Status: Within Functional Limits for tasks assessed                      Exercises      General Comments        Pertinent Vitals/Pain Pain Assessment: 0-10 Pain Score: 3  Pain Location: R hip Pain Descriptors / Indicators: Aching;Burning Pain Intervention(s): Limited activity within patient's tolerance;Monitored during session;Premedicated before session;Ice applied    Home Living                      Prior Function            PT Goals (current goals can now be found in the care plan section) Acute Rehab PT Goals Patient Stated Goal: Resume previous lifestyle with decreased pain PT Goal Formulation: With patient Time For Goal Achievement: 08/26/14 Potential to Achieve Goals: Good Progress towards PT goals: Progressing toward goals    Frequency  7X/week    PT Plan Current plan remains appropriate    Co-evaluation             End of Session Equipment Utilized During Treatment: Gait belt Activity Tolerance: Patient tolerated treatment well Patient left: in bed;with call bell/phone within reach     Time: 1432-1450 PT Time Calculation (min) (ACUTE ONLY): 18 min  Charges:  $Gait Training: 8-22 mins                    G Codes:      Eric Wood 2014-09-17, 3:37 PM

## 2014-08-22 NOTE — Progress Notes (Signed)
Physical Therapy Treatment Patient Details Name: Eric Wood MRN: [id number] DOB: Oct 02, 1955 Today's Date: 08-29-2014    History of Present Illness R THR; s/p L THR 6/16    PT Comments    Pt motivated and progressing well with mobility.  Pt hopeful for dc tomorrow am.  Follow Up Recommendations  Home health PT     Equipment Recommendations  None recommended by PT    Recommendations for Other Services OT consult     Precautions / Restrictions Precautions Precautions: Fall Restrictions Weight Bearing Restrictions: No Other Position/Activity Restrictions: WBAT    Mobility  Bed Mobility Overal bed mobility: Needs Assistance Bed Mobility: Supine to Sit       Sit to supine: Min guard   General bed mobility comments: cues for sequence and use of R LE to self assist  Transfers Overall transfer level: Needs assistance Equipment used: Rolling walker (2 wheeled) Transfers: Sit to/from Stand Sit to Stand: Min guard         General transfer comment: for safety  Ambulation/Gait Ambulation/Gait assistance: Min guard Ambulation Distance (Feet): 400 Feet Assistive device: Rolling walker (2 wheeled) Gait Pattern/deviations: Step-to pattern;Step-through pattern;Decreased step length - right;Decreased step length - left;Shuffle;Trunk flexed     General Gait Details: cues for posture, position from RW, ER on L and initial sequence   Stairs            Wheelchair Mobility    Modified Rankin (Stroke Patients Only)       Balance                                    Cognition Arousal/Alertness: Awake/alert Behavior During Therapy: WFL for tasks assessed/performed Overall Cognitive Status: Within Functional Limits for tasks assessed                      Exercises Total Joint Exercises Ankle Circles/Pumps: AROM;Both;15 reps;Supine Quad Sets: AROM;Both;10 reps;Supine Gluteal Sets: AROM;Both;10 reps;Supine Heel Slides: AAROM;Left;20  reps;Supine Hip ABduction/ADduction: AAROM;Left;Supine;20 reps Long Arc Quad: AROM;Left;10 reps;Seated    General Comments        Pertinent Vitals/Pain Pain Assessment: 0-10 Pain Score: 3  Pain Location: R hip Pain Descriptors / Indicators: Aching;Sore Pain Intervention(s): Monitored during session;Limited activity within patient's tolerance;Premedicated before session;Ice applied    Home Living                      Prior Function            PT Goals (current goals can now be found in the care plan section) Acute Rehab PT Goals Patient Stated Goal: Resume previous lifestyle with decreased pain PT Goal Formulation: With patient Time For Goal Achievement: 08/26/14 Potential to Achieve Goals: Good Progress towards PT goals: Progressing toward goals    Frequency  7X/week    PT Plan Current plan remains appropriate    Co-evaluation             End of Session Equipment Utilized During Treatment: Gait belt Activity Tolerance: Patient tolerated treatment well Patient left: in bed;with call bell/phone within reach     Time: 1050-1125 PT Time Calculation (min) (ACUTE ONLY): 35 min  Charges:  $Gait Training: 8-22 mins $Therapeutic Exercise: 8-22 mins                    G Codes:      Eric Wood 08/29/14, 12:11  PM   

## 2014-08-22 NOTE — Progress Notes (Signed)
Utilization review completed.  

## 2014-08-22 NOTE — Progress Notes (Signed)
Subjective: 2 Days Post-Op Procedure(s) (LRB): LEFT TOTAL HIP ARTHROPLASTY ANTERIOR APPROACH (Left) Patient reports pain as mild.  Wants to stay until Monday to work with PT. Otherwise no complaints.  . Objective: Vital signs in last 24 hours: Temp:  [97.5 F (36.4 C)-98.9 F (37.2 C)] 98.9 F (37.2 C) (08/21 0536) Pulse Rate:  [68-86] 76 (08/21 0536) Resp:  [16-18] 16 (08/21 0536) BP: (111-155)/(59-85) 111/59 mmHg (08/21 0536) SpO2:  [95 %-99 %] 98 % (08/21 0536)  Intake/Output from previous day: 08/20 0701 - 08/21 0700 In: 1283.8 [P.O.:960; I.V.:323.8] Out: 800 [Urine:800] Intake/Output this shift:     Recent Labs  08/21/14 0420 08/22/14 0532  HGB 10.5* 9.5*    Recent Labs  08/21/14 0420 08/22/14 0532  WBC 7.7 6.5  RBC 3.66* 3.20*  HCT 32.7* 28.9*  PLT 332 315    Recent Labs  08/21/14 0420  NA 133*  K 4.2  CL 103  CO2 23  BUN 12  CREATININE 1.02  GLUCOSE 293*  CALCIUM 8.3*   No results for input(s): LABPT, INR in the last 72 hours.  Sensation intact distally Intact pulses distally Dorsiflexion/Plantar flexion intact Incision: scant drainage Compartment soft  Assessment/Plan: 2 Days Post-Op Procedure(s) (LRB): LEFT TOTAL HIP ARTHROPLASTY ANTERIOR APPROACH (Left) Up with therapy  Monitor for symptoms of post-op anemia  CLARK, GILBERT 08/22/2014, 8:05 AM

## 2014-08-22 NOTE — Discharge Instructions (Signed)

## 2014-08-23 ENCOUNTER — Encounter (HOSPITAL_COMMUNITY): Payer: Self-pay | Admitting: Orthopaedic Surgery

## 2014-08-23 LAB — CBC
HEMATOCRIT: 28.9 % — AB (ref 39.0–52.0)
Hemoglobin: 9.7 g/dL — ABNORMAL LOW (ref 13.0–17.0)
MCH: 30.2 pg (ref 26.0–34.0)
MCHC: 33.6 g/dL (ref 30.0–36.0)
MCV: 90 fL (ref 78.0–100.0)
PLATELETS: 298 10*3/uL (ref 150–400)
RBC: 3.21 MIL/uL — AB (ref 4.22–5.81)
RDW: 13.7 % (ref 11.5–15.5)
WBC: 6.5 10*3/uL (ref 4.0–10.5)

## 2014-08-23 LAB — GLUCOSE, CAPILLARY: Glucose-Capillary: 89 mg/dL (ref 65–99)

## 2014-08-23 NOTE — Progress Notes (Signed)
Subjective: 3 Days Post-Op Procedure(s) (LRB): LEFT TOTAL HIP ARTHROPLASTY ANTERIOR APPROACH (Left) Patient reports pain as mild.  Ready to go home today no complaints.  Objective: Vital signs in last 24 hours: Temp:  [98.6 F (37 C)-99 F (37.2 C)] 98.6 F (37 C) (08/22 0401) Pulse Rate:  [78-96] 96 (08/22 0401) Resp:  [16-18] 16 (08/22 0401) BP: (129-141)/(65-77) 131/67 mmHg (08/22 0401) SpO2:  [97 %-99 %] 99 % (08/22 0401)  Intake/Output from previous day: 08/21 0701 - 08/22 0700 In: 1080 [P.O.:1080] Out: 300 [Urine:300] Intake/Output this shift:     Recent Labs  08/21/14 0420 08/22/14 0532 08/23/14 0444  HGB 10.5* 9.5* 9.7*    Recent Labs  08/22/14 0532 08/23/14 0444  WBC 6.5 6.5  RBC 3.20* 3.21*  HCT 28.9* 28.9*  PLT 315 298    Recent Labs  08/21/14 0420  NA 133*  K 4.2  CL 103  CO2 23  BUN 12  CREATININE 1.02  GLUCOSE 293*  CALCIUM 8.3*   No results for input(s): LABPT, INR in the last 72 hours.  Sensation intact distally Intact pulses distally Dorsiflexion/Plantar flexion intact Incision: scant drainage Compartment soft  Assessment/Plan: 3 Days Post-Op Procedure(s) (LRB): LEFT TOTAL HIP ARTHROPLASTY ANTERIOR APPROACH (Left) Discharge home with home health  Follow up Dr. Ninfa Linden in 2 weeks   Eric Wood 08/23/2014, 7:48 AM

## 2014-08-23 NOTE — Discharge Summary (Signed)
Patient ID: Eric Wood MRN: [id number] DOB/AGE: [birth year] 59 y.o.  Admit date: 08/20/2014 Discharge date: 08/23/2014  Admission Diagnoses:  Principal Problem:   Osteoarthritis of left hip Active Problems:   Status post total replacement of left hip   Discharge Diagnoses:  Same  Past Medical History  Diagnosis Date  . Hypertension   . Hypercholesteremia     under control  . Diabetes mellitus     type 2  . Arthritis   . Back pain     acupuncture  . Family history of adverse reaction to anesthesia     Severe PONV  . Sleep apnea     sometimes wear CPAP     Surgeries: Procedure(s): LEFT TOTAL HIP ARTHROPLASTY ANTERIOR APPROACH on 08/20/2014   Consultants:    Discharged Condition: Improved  Hospital Course: Eric Wood is an 59 y.o. male who was admitted 08/20/2014 for operative treatment ofOsteoarthritis of left hip. Patient has severe unremitting pain that affects sleep, daily activities, and work/hobbies. After pre-op clearance the patient was taken to the operating room on 08/20/2014 and underwent  Procedure(s): LEFT TOTAL HIP ARTHROPLASTY ANTERIOR APPROACH.    Patient was given perioperative antibiotics: Anti-infectives    Start     Dose/Rate Route Frequency Ordered Stop   08/21/14 0600  ceFAZolin (ANCEF) IVPB 2 g/50 mL premix     2 g 100 mL/hr over 30 Minutes Intravenous On call to O.R. 08/20/14 1228 08/20/14 1602   08/21/14 0000  ceFAZolin (ANCEF) IVPB 1 g/50 mL premix     1 g 100 mL/hr over 30 Minutes Intravenous Every 6 hours 08/20/14 1935 08/21/14 0539       Patient was given sequential compression devices, early ambulation, and chemoprophylaxis to prevent DVT.  Patient benefited maximally from hospital stay and there were no complications.    Recent vital signs: Patient Vitals for the past 24 hrs:  BP Temp Temp src Pulse Resp SpO2  08/23/14 0401 131/67 mmHg 98.6 F (37 C) Oral 96 16 99 %  08/22/14 2143 (!) 141/77 mmHg 99 F (37.2 C) Oral 92 16 99 %   08/22/14 1412 129/65 mmHg 98.9 F (37.2 C) Oral 78 18 97 %     Recent laboratory studies:  Recent Labs  08/21/14 0420 08/22/14 0532 08/23/14 0444  WBC 7.7 6.5 6.5  HGB 10.5* 9.5* 9.7*  HCT 32.7* 28.9* 28.9*  PLT 332 315 298  NA 133*  --   --   K 4.2  --   --   CL 103  --   --   CO2 23  --   --   BUN 12  --   --   CREATININE 1.02  --   --   GLUCOSE 293*  --   --   CALCIUM 8.3*  --   --      Discharge Medications:     Medication List    TAKE these medications        acetaminophen 325 MG tablet  Commonly known as:  TYLENOL  Take 325 mg by mouth every 6 (six) hours as needed for moderate pain.     amitriptyline 25 MG tablet  Commonly known as:  ELAVIL  Take 25 mg by mouth at bedtime.     aspirin 325 MG EC tablet  Take 1 tablet (325 mg total) by mouth 2 (two) times daily after a meal.     atenolol 25 MG tablet  Commonly known as:  TENORMIN  Take 25 mg by  mouth every morning.     glipiZIDE 10 MG tablet  Commonly known as:  GLUCOTROL  Take 10 mg by mouth 2 (two) times daily before a meal.     lisinopril 10 MG tablet  Commonly known as:  PRINIVIL,ZESTRIL  Take 20 mg by mouth every morning.     metFORMIN 500 MG tablet  Commonly known as:  GLUCOPHAGE  Take 500 mg by mouth 2 (two) times daily with a meal.     methocarbamol 500 MG tablet  Commonly known as:  ROBAXIN  Take 1 tablet (500 mg total) by mouth every 6 (six) hours as needed for muscle spasms.     OVER THE COUNTER MEDICATION  Take 1 tablet by mouth daily. SNS Tart     oxyCODONE-acetaminophen 5-325 MG per tablet  Commonly known as:  ROXICET  Take 1-2 tablets by mouth every 4 (four) hours as needed.     PRESCRIPTION MEDICATION  Apply 1 application topically 2 (two) times daily as needed (dry skin). Lotion he receives from the [place]     simvastatin 10 MG tablet  Commonly known as:  ZOCOR  Take 10 mg by mouth at bedtime.     traMADol 50 MG tablet  Commonly known as:  ULTRAM  Take 50 mg by mouth 2  (two) times daily as needed for moderate pain.     UNABLE TO FIND  C Pap        Diagnostic Studies: Dg C-arm 1-60 Min-no Report  08/20/2014   CLINICAL DATA: surgery   C-ARM 1-60 MINUTES  Fluoroscopy was utilized by the requesting physician.  No radiographic  interpretation.    Dg Hip Unilat With Pelvis 1v Left  08/20/2014   CLINICAL DATA:  History of osteoarthritis. Intraoperative left hip replacement.  EXAM: DG HIP (WITH OR WITHOUT PELVIS) 1V*L* ; DG HIP (WITH OR WITHOUT PELVIS) 1V PORT LEFT; DG C-ARM 1-60 MIN - NRPT [facility]  COMPARISON:  None.  FINDINGS: Bilateral total hip replacements are identified without malalignment.  IMPRESSION: Left hip total hip replacement without malalignment.   Electronically Signed   By: Abelardo Diesel M.D.   On: 08/20/2014 19:37   Dg Hip Port Unilat With Pelvis 1v Left  08/20/2014   CLINICAL DATA:  History of osteoarthritis. Intraoperative left hip replacement.  EXAM: DG HIP (WITH OR WITHOUT PELVIS) 1V*L* ; DG HIP (WITH OR WITHOUT PELVIS) 1V PORT LEFT; DG C-ARM 1-60 MIN - NRPT [facility]  COMPARISON:  None.  FINDINGS: Bilateral total hip replacements are identified without malalignment.  IMPRESSION: Left hip total hip replacement without malalignment.   Electronically Signed   By: Abelardo Diesel M.D.   On: 08/20/2014 19:37    Disposition: 06-Home-Health Care Svc        Follow-up Information    Follow up with Mcarthur Rossetti, MD In 2 weeks.   Specialty:  Orthopedic Surgery   Contact information:   [place] [address] [phone]        Signed: Erskine Emery 08/23/2014, 7:50 AM

## 2014-08-23 NOTE — Progress Notes (Signed)
Physical Therapy Treatment Patient Details Name: Eric Wood MRN: [id number] DOB: 1955/06/19 Today's Date: 08/23/2014    History of Present Illness R THR; s/p L THR 6/16    PT Comments    Pt progressing well with mobility. He walked 300' with RW with verbal cues for posture. Instructed pt in standing L hip exercises which he tolerated well.  He is ready for DC from PT standpoint.   Follow Up Recommendations  Home health PT     Equipment Recommendations  None recommended by PT    Recommendations for Other Services OT consult     Precautions / Restrictions Precautions Precautions: Fall Restrictions Weight Bearing Restrictions: No Other Position/Activity Restrictions: WBAT    Mobility  Bed Mobility               General bed mobility comments: NT up in chair  Transfers Overall transfer level: Modified independent Equipment used: Rolling walker (2 wheeled) Transfers: Sit to/from Stand Sit to Stand: Modified independent (Device/Increase time)         General transfer comment: good hand placement and safety awareness  Ambulation/Gait Ambulation/Gait assistance: Supervision Ambulation Distance (Feet): 300 Feet Assistive device: Rolling walker (2 wheeled) Gait Pattern/deviations: Step-through pattern;Trunk flexed   Gait velocity interpretation: Below normal speed for age/gender General Gait Details: cues for posture as he tends to flex trunk, able to correct with verbal cues   Stairs         General stair comments:  (min cues for sequence and foot/crutch placement)  Wheelchair Mobility    Modified Rankin (Stroke Patients Only)       Balance                                    Cognition Arousal/Alertness: Awake/alert Behavior During Therapy: WFL for tasks assessed/performed Overall Cognitive Status: Within Functional Limits for tasks assessed                      Exercises Total Joint Exercises Ankle Circles/Pumps:  AROM;Both;10 reps;Supine Heel Slides: AAROM;Left;10 reps;Supine Hip ABduction/ADduction: AROM;Left;5 reps;Standing Long Arc Quad: AROM;Left;10 reps;Seated Marching in Standing: AROM;Left;5 reps;Standing Standing Hip Extension: AROM;Left;5 reps;Standing General Exercises - Lower Extremity Heel Raises: AROM;Both;10 reps;Standing Mini-Sqauts: AROM;10 reps;Both;Standing    General Comments        Pertinent Vitals/Pain Pain Score: 6  Pain Location: L hip Pain Descriptors / Indicators: Burning Pain Intervention(s): Limited activity within patient's tolerance;Monitored during session;Premedicated before session;Ice applied    Home Living                      Prior Function            PT Goals (current goals can now be found in the care plan section) Acute Rehab PT Goals Patient Stated Goal: go to the mall and to the park PT Goal Formulation: With patient Time For Goal Achievement: 08/26/14 Potential to Achieve Goals: Good Progress towards PT goals: Goals met/education completed, patient discharged from PT    Frequency  7X/week    PT Plan Current plan remains appropriate    Co-evaluation             End of Session Equipment Utilized During Treatment: Gait belt Activity Tolerance: Patient tolerated treatment well Patient left: with call bell/phone within reach;in chair     Time: 9702-6378 PT Time Calculation (min) (ACUTE ONLY): 23 min  Charges:  $  Gait Training: 8-22 mins $Therapeutic Exercise: 8-22 mins                    G Codes:      [name] 08/23/2014, 9:06 AM [phone]

## 2014-08-23 NOTE — Op Note (Signed)
[name], Wood NO.:  [id number]  MEDICAL RECORD NO.:  [id number]  LOCATION:  3810                         FACILITY:  [facility]  PHYSICIAN:  Lind Guest. Ninfa Linden, M.D.DATE OF BIRTH:  [birth year]  DATE OF PROCEDURE:  08/20/2014 DATE OF DISCHARGE:                              OPERATIVE REPORT   PREOPERATIVE DIAGNOSIS:  Primary osteoarthritis and degenerative joint disease of left hip.  POSTOPERATIVE DIAGNOSIS:  Primary osteoarthritis and degenerative joint disease of left hip.  PROCEDURE:  Left total hip arthroplasty through direct anterior approach.  IMPLANTS:  DePuy Sector Gription acetabular component size 54 with a single screw, size 36 +4 neutral polyethylene liner, size 11 Corail femoral component with standard offset, size 36 +5 ceramic hip ball.  SURGEON:  Lind Guest. Ninfa Linden, M.D.  ASSISTANT:  Erskine Emery, P.A.  ANESTHESIA:  Spinal.  ANTIBIOTICS:  2 g IV Ancef.  BLOOD LOSS:  Less than 500 mL.  COMPLICATIONS:  None.  INDICATIONS:  Eric Wood is a 59 year old gentleman, well known to me. He has severe bilateral hip osteoarthritis,  complete joint space loss on both hips, this impacted his mobility, activities of daily living, and his quality of life.  He underwent a successful direct anterior hip replacement surgery just in June of this year and has been so successful to him that he wished to proceed with this on the left side.  His pain is daily, his mobility is limited, and his quality of life has been detrimentally affected.  At this point, he would like to proceed with a total hip arthroplasty.  He understands the risks of acute blood loss anemia, nerve and vessel injury, fracture, infection, dislocation, and DVT.  He understands the goals are decreased pain, improved mobility, and overall improved quality of life.  DESCRIPTION OF PROCEDURE:  After informed consent was obtained, appropriate left hip was marked.  He was brought  to the operating room. Spinal anesthesia was obtained, while he was on the stretcher.  Then he was laid in a supine position on a stretcher.  Traction boots were placed on both his feet.  Next, he was placed supine on the Hana fracture table with perineal post in place and both legs in inline skeletal traction devices, but no traction applied.  His left operative hip was then prepped and draped ChloraPrep and sterile drapes.  A time- out was called, and he was identified as correct patient, correct left hip.  We then made an incision inferior and posterior to the anterior superior iliac spine and carried this obliquely down the leg.  We dissected down to the tensor fascia lata muscle and then divided the tensor fascia longitudinally so we could proceed with a direct anterior approach to the hip.  We identified and cauterized the lateral femoral circumflex vessels and then divided the hip capsule and opening up the hip capsule, finding no significant joint effusion because he had essentially no joint space remaining.  We placed Cobra retractors within the hip capsule and made a femoral neck cut almost at the level of the calcar.  Due to severe disease, we removed the femoral head in its entirety and found it to be  completely devoid of cartilage.  There were significant osteophytes periarticularly, all around the acetabular, which were removed as much as we could.  We then began reaming under direct fluoroscopy and direct visualization from a size 42 reamer up to a size 54.  The last reamer was placed also under direct fluoroscopy, so we could obtain our depth of reaming, our inclination, and anteversion. Once we were pleased with this, we placed a real DePuy Sector Gription acetabular component size 54, and a single screw.  We then placed the real 36 +4 neutral polyethylene liner for that size acetabular component.  Attention was next turned to the femur  with the leg externally rotated to  100 degrees, extended and abducted, we were able to gain access to the femoral canal using a box cutting osteotome and then placed a Mueller retractor medially and a Hohmann retractor behind the greater trochanter.  We then began broaching from a size 8, broached up to a size 11, which corresponded to his other side as well.  We then trialed a standard neck and given his low neck cut at 36 +5 hip ball reduced this in the acetabulum.  Again, we were pleased with stability, range of motion, offset and leg lengths.  We then removed the trial components and placed the real size 11 Corail femoral component and the real 36 +5 ceramic hip ball.  We again reduced this in acetabulum, and we reduced this into the acetabulum and it was stable.  We then copiously irrigated the soft tissues with normal saline solution to close the iliotibial band with interrupted #1 Vicryl suture followed by 0 Vicryl in the deep tissue, 2-0 Vicryl in subcutaneous tissue, 4-0 Monocryl subcuticular stitch, and Steri-Strips on the skin.  An Aquacel dressing was applied.  He was taken off the Hana table and taken to the recovery room in stable condition.  All final counts were correct. There were no complications noted.  Of note, Erskine Emery, PAC assisted in the entire case and his assistance was crucial for facilitating all aspects of this case.     Lind Guest. Ninfa Linden, M.D.     CYB/MEDQ  D:  08/20/2014  T:  08/21/2014  Job:  [id number]

## 2014-08-24 DIAGNOSIS — I10 Essential (primary) hypertension: Secondary | ICD-10-CM | POA: Diagnosis not present

## 2014-08-24 DIAGNOSIS — E119 Type 2 diabetes mellitus without complications: Secondary | ICD-10-CM | POA: Diagnosis not present

## 2014-08-24 DIAGNOSIS — Z9181 History of falling: Secondary | ICD-10-CM | POA: Diagnosis not present

## 2014-08-24 DIAGNOSIS — M1990 Unspecified osteoarthritis, unspecified site: Secondary | ICD-10-CM | POA: Diagnosis not present

## 2014-08-24 DIAGNOSIS — Z471 Aftercare following joint replacement surgery: Secondary | ICD-10-CM | POA: Diagnosis not present

## 2014-08-24 DIAGNOSIS — Z96643 Presence of artificial hip joint, bilateral: Secondary | ICD-10-CM | POA: Diagnosis not present

## 2014-08-25 DIAGNOSIS — Z471 Aftercare following joint replacement surgery: Secondary | ICD-10-CM | POA: Diagnosis not present

## 2014-08-25 DIAGNOSIS — Z9181 History of falling: Secondary | ICD-10-CM | POA: Diagnosis not present

## 2014-08-25 DIAGNOSIS — M1990 Unspecified osteoarthritis, unspecified site: Secondary | ICD-10-CM | POA: Diagnosis not present

## 2014-08-25 DIAGNOSIS — E119 Type 2 diabetes mellitus without complications: Secondary | ICD-10-CM | POA: Diagnosis not present

## 2014-08-25 DIAGNOSIS — I10 Essential (primary) hypertension: Secondary | ICD-10-CM | POA: Diagnosis not present

## 2014-08-25 DIAGNOSIS — Z96643 Presence of artificial hip joint, bilateral: Secondary | ICD-10-CM | POA: Diagnosis not present

## 2014-08-27 DIAGNOSIS — I10 Essential (primary) hypertension: Secondary | ICD-10-CM | POA: Diagnosis not present

## 2014-08-27 DIAGNOSIS — Z9181 History of falling: Secondary | ICD-10-CM | POA: Diagnosis not present

## 2014-08-27 DIAGNOSIS — Z471 Aftercare following joint replacement surgery: Secondary | ICD-10-CM | POA: Diagnosis not present

## 2014-08-27 DIAGNOSIS — E119 Type 2 diabetes mellitus without complications: Secondary | ICD-10-CM | POA: Diagnosis not present

## 2014-08-27 DIAGNOSIS — M1990 Unspecified osteoarthritis, unspecified site: Secondary | ICD-10-CM | POA: Diagnosis not present

## 2014-08-27 DIAGNOSIS — Z96643 Presence of artificial hip joint, bilateral: Secondary | ICD-10-CM | POA: Diagnosis not present

## 2014-08-30 DIAGNOSIS — Z471 Aftercare following joint replacement surgery: Secondary | ICD-10-CM | POA: Diagnosis not present

## 2014-08-30 DIAGNOSIS — E119 Type 2 diabetes mellitus without complications: Secondary | ICD-10-CM | POA: Diagnosis not present

## 2014-08-30 DIAGNOSIS — M1990 Unspecified osteoarthritis, unspecified site: Secondary | ICD-10-CM | POA: Diagnosis not present

## 2014-08-30 DIAGNOSIS — I10 Essential (primary) hypertension: Secondary | ICD-10-CM | POA: Diagnosis not present

## 2014-08-30 DIAGNOSIS — Z96643 Presence of artificial hip joint, bilateral: Secondary | ICD-10-CM | POA: Diagnosis not present

## 2014-08-30 DIAGNOSIS — Z9181 History of falling: Secondary | ICD-10-CM | POA: Diagnosis not present

## 2014-09-01 DIAGNOSIS — E119 Type 2 diabetes mellitus without complications: Secondary | ICD-10-CM | POA: Diagnosis not present

## 2014-09-01 DIAGNOSIS — M1990 Unspecified osteoarthritis, unspecified site: Secondary | ICD-10-CM | POA: Diagnosis not present

## 2014-09-01 DIAGNOSIS — I10 Essential (primary) hypertension: Secondary | ICD-10-CM | POA: Diagnosis not present

## 2014-09-01 DIAGNOSIS — Z96643 Presence of artificial hip joint, bilateral: Secondary | ICD-10-CM | POA: Diagnosis not present

## 2014-09-01 DIAGNOSIS — Z9181 History of falling: Secondary | ICD-10-CM | POA: Diagnosis not present

## 2014-09-01 DIAGNOSIS — Z471 Aftercare following joint replacement surgery: Secondary | ICD-10-CM | POA: Diagnosis not present

## 2014-09-02 DIAGNOSIS — I10 Essential (primary) hypertension: Secondary | ICD-10-CM | POA: Diagnosis not present

## 2014-09-02 DIAGNOSIS — M1990 Unspecified osteoarthritis, unspecified site: Secondary | ICD-10-CM | POA: Diagnosis not present

## 2014-09-02 DIAGNOSIS — E119 Type 2 diabetes mellitus without complications: Secondary | ICD-10-CM | POA: Diagnosis not present

## 2014-09-02 DIAGNOSIS — Z96643 Presence of artificial hip joint, bilateral: Secondary | ICD-10-CM | POA: Diagnosis not present

## 2014-09-02 DIAGNOSIS — Z9181 History of falling: Secondary | ICD-10-CM | POA: Diagnosis not present

## 2014-09-02 DIAGNOSIS — Z471 Aftercare following joint replacement surgery: Secondary | ICD-10-CM | POA: Diagnosis not present

## 2014-09-07 ENCOUNTER — Emergency Department (HOSPITAL_COMMUNITY)
Admission: EM | Admit: 2014-09-07 | Discharge: 2014-09-08 | Disposition: A | Discharge status: Home / Self Care | Payer: Medicare Other | Attending: Emergency Medicine | Admitting: Emergency Medicine

## 2014-09-07 ENCOUNTER — Encounter (HOSPITAL_COMMUNITY): Payer: Self-pay | Admitting: Emergency Medicine

## 2014-09-07 DIAGNOSIS — F1490 Cocaine use, unspecified, uncomplicated: Secondary | ICD-10-CM | POA: Diagnosis not present

## 2014-09-07 DIAGNOSIS — M1990 Unspecified osteoarthritis, unspecified site: Secondary | ICD-10-CM | POA: Insufficient documentation

## 2014-09-07 DIAGNOSIS — Z79899 Other long term (current) drug therapy: Secondary | ICD-10-CM | POA: Insufficient documentation

## 2014-09-07 DIAGNOSIS — R259 Unspecified abnormal involuntary movements: Secondary | ICD-10-CM | POA: Diagnosis not present

## 2014-09-07 DIAGNOSIS — F911 Conduct disorder, childhood-onset type: Secondary | ICD-10-CM | POA: Diagnosis present

## 2014-09-07 DIAGNOSIS — F39 Unspecified mood [affective] disorder: Secondary | ICD-10-CM | POA: Diagnosis not present

## 2014-09-07 DIAGNOSIS — E780 Pure hypercholesterolemia: Secondary | ICD-10-CM | POA: Insufficient documentation

## 2014-09-07 DIAGNOSIS — G4730 Sleep apnea, unspecified: Secondary | ICD-10-CM | POA: Diagnosis not present

## 2014-09-07 DIAGNOSIS — Z7982 Long term (current) use of aspirin: Secondary | ICD-10-CM | POA: Diagnosis not present

## 2014-09-07 DIAGNOSIS — F1410 Cocaine abuse, uncomplicated: Secondary | ICD-10-CM | POA: Diagnosis not present

## 2014-09-07 DIAGNOSIS — R258 Other abnormal involuntary movements: Secondary | ICD-10-CM | POA: Insufficient documentation

## 2014-09-07 DIAGNOSIS — E119 Type 2 diabetes mellitus without complications: Secondary | ICD-10-CM | POA: Diagnosis not present

## 2014-09-07 DIAGNOSIS — Z046 Encounter for general psychiatric examination, requested by authority: Secondary | ICD-10-CM

## 2014-09-07 DIAGNOSIS — I10 Essential (primary) hypertension: Secondary | ICD-10-CM | POA: Diagnosis not present

## 2014-09-07 LAB — COMPREHENSIVE METABOLIC PANEL
ALBUMIN: 4 g/dL (ref 3.5–5.0)
ALK PHOS: 140 U/L — AB (ref 38–126)
ALT: 15 U/L — AB (ref 17–63)
AST: 23 U/L (ref 15–41)
Anion gap: 10 (ref 5–15)
BILIRUBIN TOTAL: 0.5 mg/dL (ref 0.3–1.2)
BUN: 8 mg/dL (ref 6–20)
CO2: 25 mmol/L (ref 22–32)
CREATININE: 0.97 mg/dL (ref 0.61–1.24)
Calcium: 9.3 mg/dL (ref 8.9–10.3)
Chloride: 106 mmol/L (ref 101–111)
GFR calc Af Amer: >60 mL/min (ref >60)
GLUCOSE: 164 mg/dL — AB (ref 65–99)
Potassium: 3.6 mmol/L (ref 3.5–5.1)
Sodium: 141 mmol/L (ref 135–145)
TOTAL PROTEIN: 8.3 g/dL — AB (ref 6.5–8.1)

## 2014-09-07 LAB — CBG MONITORING, ED
GLUCOSE-CAPILLARY: 163 mg/dL — AB (ref 65–99)
Glucose-Capillary: 168 mg/dL — ABNORMAL HIGH (ref 65–99)

## 2014-09-07 LAB — CBC
HCT: 34.2 % — ABNORMAL LOW (ref 39.0–52.0)
Hemoglobin: 11.2 g/dL — ABNORMAL LOW (ref 13.0–17.0)
MCH: 29.2 pg (ref 26.0–34.0)
MCHC: 32.7 g/dL (ref 30.0–36.0)
MCV: 89.3 fL (ref 78.0–100.0)
Platelets: 556 10*3/uL — ABNORMAL HIGH (ref 150–400)
RBC: 3.83 MIL/uL — ABNORMAL LOW (ref 4.22–5.81)
RDW: 13.5 % (ref 11.5–15.5)
WBC: 4.8 10*3/uL (ref 4.0–10.5)

## 2014-09-07 LAB — ETHANOL

## 2014-09-07 MED ORDER — GLIPIZIDE 10 MG PO TABS
10.0000 mg | ORAL_TABLET | Freq: Two times a day (BID) | ORAL | Status: DC
  Administered 2014-09-07 – 2014-09-08 (×2): 10 mg via ORAL
  Filled 2014-09-07 (×4): qty 1

## 2014-09-07 MED ORDER — TRAMADOL HCL 50 MG PO TABS
50.0000 mg | ORAL_TABLET | Freq: Two times a day (BID) | ORAL | Status: DC | PRN
  Administered 2014-09-07 – 2014-09-08 (×2): 50 mg via ORAL
  Filled 2014-09-07 (×2): qty 1

## 2014-09-07 MED ORDER — ATENOLOL 25 MG PO TABS
25.0000 mg | ORAL_TABLET | Freq: Every morning | ORAL | Status: DC
  Administered 2014-09-07 – 2014-09-08 (×2): 25 mg via ORAL
  Filled 2014-09-07 (×2): qty 1

## 2014-09-07 MED ORDER — METFORMIN HCL 500 MG PO TABS
500.0000 mg | ORAL_TABLET | Freq: Two times a day (BID) | ORAL | Status: DC
  Administered 2014-09-07 – 2014-09-08 (×2): 500 mg via ORAL
  Filled 2014-09-07 (×4): qty 1

## 2014-09-07 MED ORDER — ACETAMINOPHEN 325 MG PO TABS
325.0000 mg | ORAL_TABLET | Freq: Four times a day (QID) | ORAL | Status: DC | PRN
  Administered 2014-09-08: 325 mg via ORAL
  Filled 2014-09-07: qty 1

## 2014-09-07 MED ORDER — AMITRIPTYLINE HCL 25 MG PO TABS
25.0000 mg | ORAL_TABLET | Freq: Every day | ORAL | Status: DC
  Administered 2014-09-07: 25 mg via ORAL
  Filled 2014-09-07: qty 1

## 2014-09-07 MED ORDER — SIMVASTATIN 10 MG PO TABS
10.0000 mg | ORAL_TABLET | Freq: Every day | ORAL | Status: DC
  Administered 2014-09-07: 10 mg via ORAL
  Filled 2014-09-07 (×2): qty 1

## 2014-09-07 MED ORDER — LISINOPRIL 20 MG PO TABS
20.0000 mg | ORAL_TABLET | Freq: Every morning | ORAL | Status: DC
  Administered 2014-09-07 – 2014-09-08 (×2): 20 mg via ORAL
  Filled 2014-09-07 (×2): qty 1

## 2014-09-07 MED ORDER — ASPIRIN EC 325 MG PO TBEC
325.0000 mg | DELAYED_RELEASE_TABLET | Freq: Two times a day (BID) | ORAL | Status: DC
Start: 2014-09-07 — End: 2014-09-08
  Administered 2014-09-07 – 2014-09-08 (×2): 325 mg via ORAL
  Filled 2014-09-07 (×4): qty 1

## 2014-09-07 NOTE — ED Notes (Signed)
Patient was brought in by [facility] for involuntary commitment.  Denies SI, denies harming himself or others.  He states he has a history of depression. No history of drug abuse.  Brother took out paper's on him for involuntary commitment.

## 2014-09-07 NOTE — BH Assessment (Addendum)
Tele Assessment Note   Eric Wood is an 59 y.o. male that was assessed this day via tele assessment with this clinician.  Clinical information gathered from [place], [place].  Pt brought to [facility] by police under IVC papers taken out on the pt by pt's brother.  Per IVC papers, pt had been out on a cocaine binge for days, was threatening his brother and others in the home, was threatening to kill people, tried to jump on his brother in his bed, and is paranoid, thinking his shoes were stolen but he still has his shoes per the brother.  Attempted to call pt's brother for collateral information and there was no answer.  Per pt, he has been gone to [place] to see his girlfriend, admits to using "a little cocaine" and stated his brother is mad at him because he has full disability and that there has been a financial dispute.  Pt denies SI, HI or AVH.  No delusions noted.  Per pt's chart, he does have a hx of hallucinations secondary to substance abuse.  Pt denies depressive sx, stating he takes his medications as prescribed and just went to [facility] today.  Pt stated his brother is a "compulsive liar" and is requesting to go home.  Pt cooperative, pleasant, oriented x 4, has rapid speech, logical/coherent thought processes, good eye contact, and appeared somewhat anxious.  Consulted with Charmaine Downs, NP who recommends pt remain in ED overnight and be evaluated by psychiatry in AM.  Updated PA Harris who was in agreement with pt disposition.  TTS to continue to try to reach pt's brother.  Updated ED and TTS.  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 Hypertension   . Hypercholesteremia     under control  . Diabetes mellitus     type 2  . Arthritis   . Back pain     acupuncture  . Family history of adverse reaction to anesthesia     Severe PONV  . Sleep apnea     sometimes wear CPAP     Axis IV: housing problems, other psychosocial or environmental problems, problems related to social environment and problems with primary support group Axis V: 41-50 serious symptoms  Past Medical History:  Past Medical History  Diagnosis Date  . Hypertension   . Hypercholesteremia     under control  . Diabetes mellitus     type 2  . Arthritis   . Back pain     acupuncture  . Family history of adverse reaction to anesthesia     Severe PONV  . Sleep apnea     sometimes wear CPAP     Past Surgical History  Procedure Laterality Date  . No past surgeries    . Colonoscopy w/ polypectomy    . Acupuncture      For back and hip  . Total hip arthroplasty Right 06/11/2014    Procedure: RIGHT TOTAL HIP ARTHROPLASTY ANTERIOR APPROACH;  Surgeon: Mcarthur Rossetti, MD;  Location: [facility];  Service: Orthopedics;  Laterality: Right;  . Total hip arthroplasty Left 08/20/2014    Procedure: LEFT TOTAL HIP ARTHROPLASTY ANTERIOR APPROACH;  Surgeon: Mcarthur Rossetti, MD;  Location: [facility];  Service: Orthopedics;  Laterality: Left;    Family History: No family history on file.  Social History:  reports that he has never smoked. He has never used smokeless tobacco.  He reports that he drinks alcohol. He reports that he does not use illicit drugs.  Additional Social History:  Alcohol / Drug Use Pain Medications: see med list Prescriptions: see med list Over the Counter: see med list History of alcohol / drug use?: Yes (pt did not elaborate) Substance #1 Name of Substance 1: crack cocaine 1 - Age of First Use: unk 1 - Amount (size/oz): unk 1 - Frequency: unk 1 - Duration: unk 1 - Last Use / Amount: unk  CIWA: CIWA-Ar BP: 144/80 mmHg Pulse Rate: 114 COWS:    PATIENT STRENGTHS: (choose at least two) Average or above average intelligence General fund of knowledge  Allergies: No Active Allergies  Home Medications:  (Not in a hospital admission)  OB/GYN Status:  No LMP for  male patient.  General Assessment Data [facility] of Assessment: [facility] TTS Assessment: In system Is this a Tele or Face-to-Face Assessment?: Tele Assessment Is this an Initial Assessment or a Re-assessment for this encounter?: Initial Assessment Marital status: Single Maiden name:  (na) Is patient pregnant?:  (na) Pregnancy Status:  (na) Living Arrangements: Other relatives Can pt return to current living arrangement?: Yes Admission Status: Involuntary Is patient capable of signing voluntary admission?: Yes Referral Source: Self/Family/Friend Insurance type: Medicare  Medical Screening Exam ([place]) Medical Exam completed:  (na)  Crisis Care Plan Living Arrangements: Other relatives Name of Psychiatrist: [place] Name of Therapist: none  Education Status Is patient currently in school?: No Current Grade: na Highest grade of school patient has completed: unka Name of school: na Contact person: na  Risk to self with the past 6 months Suicidal Ideation: No Has patient been a risk to self within the past 6 months prior to admission? : No Suicidal Intent: No Has patient had any suicidal intent within the past 6 months prior to admission? : No Is patient at risk for suicide?: No Suicidal Plan?: No Has patient had any suicidal plan within the past 6 months prior to admission? : No Access to Means: No What has been your use of drugs/alcohol within the last 12 months?: pt has hx of cocaine use, admits to using occ, did not elaborate Previous Attempts/Gestures: No How many times?: 0 Other Self Harm Risks: na-pt denies Triggers for Past Attempts: None known Intentional Self Injurious Behavior: None Family Suicide History: Unknown Recent stressful life event(s): Conflict (Comment), Other (Comment) (surgery, conflict with brother) Persecutory voices/beliefs?: No Depression:  (pt has hx of depression) Depression Symptoms: Despondent Substance abuse history and/or  treatment for substance abuse?: Yes Suicide prevention information given to non-admitted patients: Not applicable  Risk to Others within the past 6 months Homicidal Ideation: No Does patient have any lifetime risk of violence toward others beyond the six months prior to admission? : No Thoughts of Harm to Others: Yes-Currently Present Comment - Thoughts of Harm to Others: per brother, pt has been threating others in the home Current Homicidal Intent: No Current Homicidal Plan: No Access to Homicidal Means: No Identified Victim: na-pt denies History of harm to others?: Yes Assessment of Violence: On admission Violent Behavior Description: per IVC, threatening brother and others in home Does patient have access to weapons?: No Criminal Charges Pending?: No Does patient have a court date: No Is patient on probation?: No  Psychosis Hallucinations: None noted (pt has hx of hallucinations) Delusions: None noted  Mental Status Report Appearance/Hygiene: In scrubs Eye Contact: Good Motor Activity: Freedom of movement, Unremarkable Speech: Rapid, Logical/coherent Level of Consciousness:  Alert Mood: Euthymic Affect: Appropriate to circumstance Anxiety Level: Minimal Thought Processes: Coherent, Relevant Judgement: Unimpaired Orientation: Person, Place, Time, Situation Obsessive Compulsive Thoughts/Behaviors: None  Cognitive Functioning Concentration: Normal Memory: Recent Intact, Remote Intact IQ: Average Insight: Fair Impulse Control: Unable to Assess Appetite: Good Weight Loss: 0 Weight Gain: 0 Sleep: No Change Total Hours of Sleep:  (pt reports he sleeps well) Vegetative Symptoms: None  ADLScreening [facility] Assessment Services) Patient's cognitive ability adequate to safely complete daily activities?: Yes Patient able to express need for assistance with ADLs?: Yes Independently performs ADLs?: Yes (appropriate for developmental age)  Prior Inpatient Therapy Prior  Inpatient Therapy: Yes Prior Therapy Dates: 8 years ago Prior Therapy Facilty/Provider(s): facility in [place], [place] Reason for Treatment: Hallucinations  Prior Outpatient Therapy Prior Outpatient Therapy: Yes Prior Therapy Dates: current Prior Therapy Facilty/Provider(s): [facility] Reason for Treatment: Med mgnt Does patient have an ACCT team?: No Does patient have Intensive In-House Services?  : No Does patient have [facility] services? : No Does patient have P4CC services?: No  ADL Screening (condition at time of admission) Patient's cognitive ability adequate to safely complete daily activities?: Yes Is the patient deaf or have difficulty hearing?: No Does the patient have difficulty seeing, even when wearing glasses/contacts?: No Does the patient have difficulty concentrating, remembering, or making decisions?: No Patient able to express need for assistance with ADLs?: Yes Does the patient have difficulty dressing or bathing?: No Independently performs ADLs?: Yes (appropriate for developmental age)  Home Assistive Devices/Equipment Home Assistive Devices/Equipment: None    Abuse/Neglect Assessment (Assessment to be complete while patient is alone) Physical Abuse: Denies Verbal Abuse: Denies Sexual Abuse: Denies Exploitation of patient/patient's resources: Denies Self-Neglect: Denies Values / Beliefs Cultural Requests During Hospitalization: None Spiritual Requests During Hospitalization: None Consults Spiritual Care Consult Needed: No Social Work Consult Needed: No Regulatory affairs officer (For Healthcare) Does patient have an advance directive?: No Would patient like information on creating an advanced directive?: No - patient declined information    Additional Information 1:1 In Past 12 Months?: No CIRT Risk: No Elopement Risk: No Does patient have medical clearance?: Yes     Disposition:  Disposition Initial Assessment Completed for this Encounter: Yes Disposition  of Patient: Other dispositions Other disposition(s): Other (Comment) (Pt to remain in ED overnight and be reassessed in AM)  Shaune Pascal, MS, [facility]   09/07/2014 4:33 PM

## 2014-09-07 NOTE — ED Provider Notes (Signed)
CSN: [id number]     Arrival date & time 09/07/14  1404 History  This chart was scribed for non-physician practitioner, Margarita Mail, PA-C,  working with Daleen Bo, MD by Evelene Croon, ED Scribe. This patient was seen in room [facility] and the patient's care was started at 3:29 PM.  Chief Complaint  Patient presents with  . IVC   . Aggressive Behavior   The history is provided by the patient. No language interpreter was used.   HPI Comments:  Eric Wood is a 59 y.o. male brought in by [employer] who presents to the Emergency Department under IVC. He was out of town visiting a friend for a few days and his brother claimed to be worried about him; his brother told this to a judge to have him IVC'd. Pt states his brother is an unreliable source as his abuses illicit drugs and there is currently a family dispute over money.  Pt denies SI, HI, hallucinations and depression. He has no physical complaints/symptoms at this time.   Past Medical History  Diagnosis Date  . Hypertension   . Hypercholesteremia     under control  . Diabetes mellitus     type 2  . Arthritis   . Back pain     acupuncture  . Family history of adverse reaction to anesthesia     Severe PONV  . Sleep apnea     sometimes wear CPAP    Past Surgical History  Procedure Laterality Date  . No past surgeries    . Colonoscopy w/ polypectomy    . Acupuncture      For back and hip  . Total hip arthroplasty Right 06/11/2014    Procedure: RIGHT TOTAL HIP ARTHROPLASTY ANTERIOR APPROACH;  Surgeon: Mcarthur Rossetti, MD;  [facility];  Service: Orthopedics;  Laterality: Right;  . Total hip arthroplasty Left 08/20/2014    Procedure: LEFT TOTAL HIP ARTHROPLASTY ANTERIOR APPROACH;  Surgeon: Mcarthur Rossetti, MD;  Location[facility];  Service: Orthopedics;  Laterality: Left;   No family history on file. Social History  Substance Use Topics  . Smoking status: Never Smoker   . Smokeless tobacco: Never Used  . Alcohol  Use: Yes     Comment: occ beer     Review of Systems  Constitutional: Negative for fever and chills.  Respiratory: Negative for shortness of breath.   Cardiovascular: Negative for chest pain.  Gastrointestinal: Negative for abdominal pain.  Skin: Negative for rash.  Psychiatric/Behavioral: Negative for suicidal ideas, hallucinations and behavioral problems.  Ten systems reviewed and are negative for acute change, except as noted in the HPI.    Allergies  Review of patient's allergies indicates no active allergies.  Home Medications   Prior to Admission medications   Medication Sig Start Date End Date Taking? Authorizing Provider  acetaminophen (TYLENOL) 325 MG tablet Take 325 mg by mouth every 6 (six) hours as needed for moderate pain.   Yes Historical Provider, MD  amitriptyline (ELAVIL) 25 MG tablet Take 25 mg by mouth at bedtime.   Yes Historical Provider, MD  aspirin EC 325 MG EC tablet Take 1 tablet (325 mg total) by mouth 2 (two) times daily after a meal. 06/12/14  Yes Mcarthur Rossetti, MD  atenolol (TENORMIN) 25 MG tablet Take 25 mg by mouth every morning.    Yes Historical Provider, MD  glipiZIDE (GLUCOTROL) 10 MG tablet Take 10 mg by mouth 2 (two) times daily before a meal.   Yes Historical  Provider, MD  lisinopril (PRINIVIL,ZESTRIL) 10 MG tablet Take 20 mg by mouth every morning.    Yes Historical Provider, MD  metFORMIN (GLUCOPHAGE) 500 MG tablet Take 500 mg by mouth 2 (two) times daily with a meal.   Yes Historical Provider, MD  PRESCRIPTION MEDICATION Apply 1 application topically 2 (two) times daily as needed (dry skin). Lotion he receives from the [place]   Yes Historical Provider, MD  simvastatin (ZOCOR) 10 MG tablet Take 10 mg by mouth at bedtime.   Yes Historical Provider, MD  traMADol (ULTRAM) 50 MG tablet Take 50 mg by mouth 2 (two) times daily as needed for moderate pain.   Yes Historical Provider, MD  UNABLE TO FIND C Pap   Yes Historical Provider, MD   BP  156/87 mmHg  Pulse 66  Temp(Src) 98.3 F (36.8 C) (Oral)  Resp 14  Ht 5\' 10"  (1.778 m)  Wt 209 lb (94.802 kg)  BMI 29.99 kg/m2  SpO2 100% Physical Exam  Constitutional: He appears well-developed and well-nourished. No distress.  HENT:  Head: Normocephalic and atraumatic.  Eyes: Conjunctivae are normal.  Neck: Normal range of motion.  Cardiovascular: Normal rate.   Pulmonary/Chest: Effort normal.  Musculoskeletal: Normal range of motion.  Neurological: He is alert.  Skin: Skin is warm and dry.  Nursing note and vitals reviewed.   ED Course  Procedures   DIAGNOSTIC STUDIES:  Oxygen Saturation is 99% on RA, normal by my interpretation.    COORDINATION OF CARE:  3:37 PM Discussed treatment plan with pt at bedside and pt agreed to plan.  Labs Review Labs Reviewed  COMPREHENSIVE METABOLIC PANEL - Abnormal; Notable for the following:    Glucose, Bld 164 (*)    Total Protein 8.3 (*)    ALT 15 (*)    Alkaline Phosphatase 140 (*)    All other components within normal limits  CBC - Abnormal; Notable for the following:    RBC 3.83 (*)    Hemoglobin 11.2 (*)    HCT 34.2 (*)    Platelets 556 (*)    All other components within normal limits  URINE RAPID DRUG SCREEN, HOSP PERFORMED - Abnormal; Notable for the following:    Cocaine POSITIVE (*)    All other components within normal limits  CBG MONITORING, ED - Abnormal; Notable for the following:    Glucose-Capillary 163 (*)    All other components within normal limits  CBG MONITORING, ED - Abnormal; Notable for the following:    Glucose-Capillary 168 (*)    All other components within normal limits  ETHANOL    Imaging Review No results found. I have personally reviewed and evaluated these images and lab results as part of my medical decision-making.   EKG Interpretation None      MDM   Final diagnoses:  Involuntary commitment    Patient under IVC Will allow for psych consult. No overt psychotic behavior,  no si/hi/avh  I personally performed the services described in this documentation, which was scribed in my presence. The recorded information has been reviewed and is accurate.      Margarita Mail, PA-C 09/10/14 1857  Daleen Bo, MD 09/19/14 [phone]

## 2014-09-07 NOTE — ED Notes (Signed)
Took pt Sprite.

## 2014-09-08 DIAGNOSIS — F39 Unspecified mood [affective] disorder: Secondary | ICD-10-CM | POA: Diagnosis not present

## 2014-09-08 DIAGNOSIS — F1490 Cocaine use, unspecified, uncomplicated: Secondary | ICD-10-CM

## 2014-09-08 LAB — RAPID URINE DRUG SCREEN, HOSP PERFORMED
Amphetamines: NOT DETECTED
BENZODIAZEPINES: NOT DETECTED
Barbiturates: NOT DETECTED
Cocaine: POSITIVE — AB
Opiates: NOT DETECTED
Tetrahydrocannabinol: NOT DETECTED

## 2014-09-08 NOTE — BHH Suicide Risk Assessment (Cosign Needed)
Suicide Risk Assessment  Discharge Assessment   [facility] Discharge Suicide Risk Assessment   Demographic Factors:  Male, Unemployed and retired  Total Time spent with patient: 20 minutes  Musculoskeletal: Strength & Muscle Tone: within normal limits Gait & Station: normal Patient leans: N/A  Psychiatric Specialty Exam:     Blood pressure 156/87, pulse 66, temperature 98.3 F (36.8 C), temperature source Oral, resp. rate 14, height 5\' 10"  (1.778 m), weight 94.802 kg (209 lb), SpO2 100 %.Body mass index is 29.99 kg/(m^2).  General Appearance: Casual and Fairly Groomed  Engineer, water:: Good  Speech: Clear and Coherent and Normal Rate  Volume: Normal  Mood: Euthymic  Affect: Congruent  Thought Process: Coherent and Goal Directed  Orientation: Full (Time, Place, and Person)  Thought Content: WDL  Suicidal Thoughts: No  Homicidal Thoughts: No  Memory: Immediate; Good Recent; Good Remote; Good  Judgement: Good  Insight: Good  Psychomotor Activity: Normal  Concentration: Good  Recall: NA  Fund of Knowledge:Good  Language: Good  Akathisia: NA  Handed: Right  AIMS (if indicated):    Assets: Desire for Improvement  ADL's: Intact  Cognition: WNL             Has this patient used any form of tobacco in the last 30 days? (Cigarettes, Smokeless Tobacco, Cigars, and/or Pipes) N/A  Mental Status Per Nursing Assessment::   On Admission:     Current Mental Status by Physician: NA  Loss Factors: NA  Historical Factors: NA  Risk Reduction Factors:   Sense of responsibility to family, Religious beliefs about death, Positive social support and Positive therapeutic relationship  Continued Clinical Symptoms:  Depression:   Insomnia  Cognitive Features That Contribute To Risk:  Polarized thinking    Suicide Risk:  Minimal: No identifiable suicidal ideation.  Patients presenting with no risk factors but with morbid  ruminations; may be classified as minimal risk based on the severity of the depressive symptoms  Principal Problem: Mood disorder Discharge Diagnoses:  Patient Active Problem List   Diagnosis Date Noted  . Mood disorder [F39] 09/08/2014    Priority: High  . Osteoarthritis of left hip [M16.12] 08/20/2014  . 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 Osteoarthritis of right hip [M16.11] 06/11/2014  . 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      Plan Of Care/Follow-up recommendations:  Activity:  as tolerated Diet:  regular  Is patient on multiple antipsychotic therapies at discharge:  No   Has Patient had three or more failed trials of antipsychotic monotherapy by history:  No  Recommended Plan for Multiple Antipsychotic Therapies: NA    Alanis Clift C   PMHNP-BC 09/08/2014, 1:22 PM

## 2014-09-08 NOTE — Consult Note (Signed)
[facility] Consult   Reason for Consult:  Unspecified mood disorder, Cocaine use disorder, moderate Referring Physician:  EDP Patient Identification: Eric Wood MRN:  [id number] Principal Diagnosis: Mood disorder Diagnosis:   Patient Active Problem List   Diagnosis Date Noted  . Mood disorder [F39] 09/08/2014    Priority: High  . Osteoarthritis of left hip [M16.12] 08/20/2014  . 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 Osteoarthritis of right hip [M16.11] 06/11/2014  . Status post total replacement of right hip [Z96.649] 06/11/2014    Total Time spent with patient: 1 hour  Subjective:   Eric Wood is a 59 y.o. male patient admitted with Unspecified mood disorder, Cocaine use disorder, moderate  HPI:  AA male, Veteran 59 years old was evaluated after he was IVC by his brother yesterday.  Patient was IVC by his brother who felt that he is using excessive amount of Cocaine.  Patient stated that he was surprised and confused as to the reason why his brother committed him.  He stated that he shares a home with his brothers and that he pays most of the bill.  Patient states that he is living a good life and that he is in a relationship with a young male.  Patient admits to a diagnosis of MDD and stated that he takes Amitriptyline at night for sleep and depression.  Patient reports good sleep and appetite.  He reports enjoying his retirement.  He denies SI/HI/AVH and has vehemently denied past suicide attempt.  Patient is now discharged home and we have rescinded his IVC.    HPI Elements:   Location:  Unspecified mood disorder. Quality:  moderate. Severity:  moderate. Timing:  acute. Duration:  Sudden. Context:  IVC by brother for using too much Cocaine.  Past Medical History:  Past Medical History  Diagnosis Date  . Hypertension   . Hypercholesteremia     under control  . Diabetes mellitus     type 2  . Arthritis   . Back pain      acupuncture  . Family history of adverse reaction to anesthesia     Severe PONV  . Sleep apnea     sometimes wear CPAP     Past Surgical History  Procedure Laterality Date  . No past surgeries    . Colonoscopy w/ polypectomy    . Acupuncture      For back and hip  . Total hip arthroplasty Right 06/11/2014    Procedure: RIGHT TOTAL HIP ARTHROPLASTY ANTERIOR APPROACH;  Surgeon: Mcarthur Rossetti, MD;  Location: [facility];  Service: Orthopedics;  Laterality: Right;  . Total hip arthroplasty Left 08/20/2014    Procedure: LEFT TOTAL HIP ARTHROPLASTY ANTERIOR APPROACH;  Surgeon: Mcarthur Rossetti, MD;  Location: [facility];  Service: Orthopedics;  Laterality: Left;   Family History: No family history on file. Social History:  History  Alcohol Use  . Yes    Comment: occ beer      History  Drug Use No    Comment: last time 1-2 years ago    Social History   Social History  . Marital Status: Divorced    Spouse Name: N/A  . Number of Children: N/A  . Years of Education: N/A   Social History Main Topics  . Smoking status: Never Smoker   . Smokeless tobacco: Never Used  . Alcohol Use: Yes     Comment: occ beer   . Drug Use: No  Comment: last time 1-2 years ago  . Sexual Activity: Not Currently   Other Topics Concern  . None   Social History Narrative   Additional Social History:    Pain Medications: see med list Prescriptions: see med list Over the Counter: see med list History of alcohol / drug use?: Yes (pt did not elaborate) Name of Substance 1: crack cocaine 1 - Age of First Use: unk 1 - Amount (size/oz): unk 1 - Frequency: unk 1 - Duration: unk 1 - Last Use / Amount: unk                   Allergies:  No Active Allergies  Labs:  Results for orders placed or performed during the hospital encounter of 09/07/14 (from the past 48 hour(s))  Comprehensive metabolic panel     Status: Abnormal   Collection Time: 09/07/14  2:54 PM  Result Value Ref Range    Sodium 141 135 - 145 mmol/L   Potassium 3.6 3.5 - 5.1 mmol/L   Chloride 106 101 - 111 mmol/L   CO2 25 22 - 32 mmol/L   Glucose, Bld 164 (H) 65 - 99 mg/dL   BUN 8 6 - 20 mg/dL   Creatinine, Ser 0.97 0.61 - 1.24 mg/dL   Calcium 9.3 8.9 - 10.3 mg/dL   Total Protein 8.3 (H) 6.5 - 8.1 g/dL   Albumin 4.0 3.5 - 5.0 g/dL   AST 23 15 - 41 U/L   ALT 15 (L) 17 - 63 U/L   Alkaline Phosphatase 140 (H) 38 - 126 U/L   Total Bilirubin 0.5 0.3 - 1.2 mg/dL   GFR calc non Af Amer >60 >60 mL/min   GFR calc Af Amer >60 >60 mL/min    Comment: (NOTE) The eGFR has been calculated using the CKD EPI equation. This calculation has not been validated in all clinical situations. eGFR's persistently <60 mL/min signify possible Chronic Kidney Disease.    Anion gap 10 5 - 15  CBC     Status: Abnormal   Collection Time: 09/07/14  2:54 PM  Result Value Ref Range   WBC 4.8 4.0 - 10.5 K/uL   RBC 3.83 (L) 4.22 - 5.81 MIL/uL   Hemoglobin 11.2 (L) 13.0 - 17.0 g/dL   HCT 34.2 (L) 39.0 - 52.0 %   MCV 89.3 78.0 - 100.0 fL   MCH 29.2 26.0 - 34.0 pg   MCHC 32.7 30.0 - 36.0 g/dL   RDW 13.5 11.5 - 15.5 %   Platelets 556 (H) 150 - 400 K/uL  Ethanol (ETOH)     Status: None   Collection Time: 09/07/14  2:55 PM  Result Value Ref Range   Alcohol, Ethyl (B) <5 <5 mg/dL    Comment:        LOWEST DETECTABLE LIMIT FOR SERUM ALCOHOL IS 5 mg/dL FOR MEDICAL PURPOSES ONLY   POC CBG, ED     Status: Abnormal   Collection Time: 09/07/14  3:18 PM  Result Value Ref Range   Glucose-Capillary 163 (H) 65 - 99 mg/dL  CBG monitoring, ED     Status: Abnormal   Collection Time: 09/07/14  4:14 PM  Result Value Ref Range   Glucose-Capillary 168 (H) 65 - 99 mg/dL  Urine rapid drug screen (hosp performed) (Not at [facility])     Status: Abnormal   Collection Time: 09/08/14  4:18 AM  Result Value Ref Range   Opiates NONE DETECTED NONE DETECTED   Cocaine POSITIVE (A) NONE  DETECTED   Benzodiazepines NONE DETECTED NONE DETECTED    Amphetamines NONE DETECTED NONE DETECTED   Tetrahydrocannabinol NONE DETECTED NONE DETECTED   Barbiturates NONE DETECTED NONE DETECTED    Comment:        DRUG SCREEN FOR MEDICAL PURPOSES ONLY.  IF CONFIRMATION IS NEEDED FOR ANY PURPOSE, NOTIFY LAB WITHIN 5 DAYS.        LOWEST DETECTABLE LIMITS FOR URINE DRUG SCREEN Drug Class       Cutoff (ng/mL) Amphetamine      1000 Barbiturate      200 Benzodiazepine   549 Tricyclics       826 Opiates          300 Cocaine          300 THC              50     Vitals: Blood pressure 156/87, pulse 66, temperature 98.3 F (36.8 C), temperature source Oral, resp. rate 14, height _0  (1.778 m), weight 94.802 kg (209 lb), SpO2 100 %.  Risk to Self: Suicidal Ideation: No Suicidal Intent: No Is patient at risk for suicide?: No Suicidal Plan?: No Access to Means: No What has been your use of drugs/alcohol within the last 12 months?: pt has hx of cocaine use, admits to using occ, did not elaborate How many times?: 0 Other Self Harm Risks: na-pt denies Triggers for Past Attempts: None known Intentional Self Injurious Behavior: None Risk to Others: Homicidal Ideation: No Thoughts of Harm to Others: Yes-Currently Present Comment - Thoughts of Harm to Others: per brother, pt has been threating others in the home Current Homicidal Intent: No Current Homicidal Plan: No Access to Homicidal Means: No Identified Victim: na-pt denies History of harm to others?: Yes Assessment of Violence: On admission Violent Behavior Description: per IVC, threatening brother and others in home Does patient have access to weapons?: No Criminal Charges Pending?: No Does patient have a court date: No Prior Inpatient Therapy: Prior Inpatient Therapy: Yes Prior Therapy Dates: 8 years ago Prior Therapy Facilty/Provider(s): facility in [place], [place] Reason for Treatment: Hallucinations Prior Outpatient Therapy: Prior Outpatient Therapy: Yes Prior Therapy Dates:  current Prior Therapy Facilty/Provider(s): [facility] Reason for Treatment: Med mgnt Does patient have an ACCT team?: No Does patient have Intensive In-House Services?  : No Does patient have [facility] services? : No Does patient have P4CC services?: No  Current Facility-Administered Medications  Medication Dose Route Frequency Provider Last Rate Last Dose  . acetaminophen (TYLENOL) tablet 325 mg  325 mg Oral Q6H PRN Margarita Mail, PA-C   325 mg at 09/08/14 0036  . amitriptyline (ELAVIL) tablet 25 mg  25 mg Oral QHS Margarita Mail, PA-C   25 mg at 09/07/14 2139  . aspirin EC tablet 325 mg  325 mg Oral BID PC Abigail Harris, PA-C   325 mg at 09/08/14 1000  . atenolol (TENORMIN) tablet 25 mg  25 mg Oral q morning - 10a Margarita Mail, PA-C   25 mg at 09/08/14 1000  . glipiZIDE (GLUCOTROL) tablet 10 mg  10 mg Oral BID AC Abigail Harris, PA-C   10 mg at 09/08/14 1000  . lisinopril (PRINIVIL,ZESTRIL) tablet 20 mg  20 mg Oral q morning - 10a Margarita Mail, PA-C   20 mg at 09/08/14 1000  . metFORMIN (GLUCOPHAGE) tablet 500 mg  500 mg Oral BID WC Margarita Mail, PA-C   500 mg at 09/08/14 1000  . simvastatin (ZOCOR) tablet 10 mg  10  mg Oral QHS Margarita Mail, PA-C   10 mg at 09/07/14 2139  . traMADol (ULTRAM) tablet 50 mg  50 mg Oral BID PRN Margarita Mail, PA-C   50 mg at 09/08/14 1000   Current Outpatient Prescriptions  Medication Sig Dispense Refill  . acetaminophen (TYLENOL) 325 MG tablet Take 325 mg by mouth every 6 (six) hours as needed for moderate pain.    Marland Kitchen amitriptyline (ELAVIL) 25 MG tablet Take 25 mg by mouth at bedtime.    Marland Kitchen aspirin EC 325 MG EC tablet Take 1 tablet (325 mg total) by mouth 2 (two) times daily after a meal. 30 tablet 0  . atenolol (TENORMIN) 25 MG tablet Take 25 mg by mouth every morning.     Marland Kitchen glipiZIDE (GLUCOTROL) 10 MG tablet Take 10 mg by mouth 2 (two) times daily before a meal.    . lisinopril (PRINIVIL,ZESTRIL) 10 MG tablet Take 20 mg by mouth every morning.      . metFORMIN (GLUCOPHAGE) 500 MG tablet Take 500 mg by mouth 2 (two) times daily with a meal.    . oxyCODONE-acetaminophen (ROXICET) 5-325 MG per tablet Take 1-2 tablets by mouth every 4 (four) hours as needed. 60 tablet 0  . PRESCRIPTION MEDICATION Apply 1 application topically 2 (two) times daily as needed (dry skin). Lotion he receives from the [place]    . simvastatin (ZOCOR) 10 MG tablet Take 10 mg by mouth at bedtime.    . traMADol (ULTRAM) 50 MG tablet Take 50 mg by mouth 2 (two) times daily as needed for moderate pain.    Marland Kitchen UNABLE TO FIND C Pap    . methocarbamol (ROBAXIN) 500 MG tablet Take 1 tablet (500 mg total) by mouth every 6 (six) hours as needed for muscle spasms. (Patient not taking: Reported on 09/07/2014) 60 tablet 0    Musculoskeletal: Strength & Muscle Tone: within normal limits Gait & Station: normal Patient leans: N/A  Psychiatric Specialty Exam: Physical Exam  Review of Systems  Constitutional: Negative.   HENT: Negative.   Eyes: Negative.   Respiratory: Negative.   Cardiovascular: Negative.   Gastrointestinal: Negative.   Genitourinary: Negative.   Musculoskeletal: Negative.        RECOVERING FROM RIGHT AND LEFT HIP REPLACEMENT  Skin: Negative.   Neurological: Negative.   Endo/Heme/Allergies:       Hx of DM    Blood pressure 156/87, pulse 66, temperature 98.3 F (36.8 C), temperature source Oral, resp. rate 14, height _0  (1.778 m), weight 94.802 kg (209 lb), SpO2 100 %.Body mass index is 29.99 kg/(m^2).  General Appearance: Casual and Fairly Groomed  Engineer, water::  Good  Speech:  Clear and Coherent and Normal Rate  Volume:  Normal  Mood:  Euthymic  Affect:  Congruent  Thought Process:  Coherent and Goal Directed  Orientation:  Full (Time, Place, and Person)  Thought Content:  WDL  Suicidal Thoughts:  No  Homicidal Thoughts:  No  Memory:  Immediate;   Good Recent;   Good Remote;   Good  Judgement:  Good  Insight:  Good  Psychomotor Activity:   Normal  Concentration:  Good  Recall:  NA  Fund of Knowledge:Good  Language: Good  Akathisia:  NA  Handed:  Right  AIMS (if indicated):     Assets:  Desire for Improvement  ADL's:  Intact  Cognition: WNL  Sleep:      Medical Decision Making: Established Problem, Stable/Improving (1)  Disposition: Discharge home, follow up  with the [facility] providers.  Delfin Gant   PMHNP-BC 09/08/2014 12:58 PM Patient seen face-to-face for psychiatric evaluation, chart reviewed and case discussed with the physician extender and developed treatment plan. Reviewed the information documented and agree with the treatment plan. Corena Pilgrim, MD

## 2014-09-08 NOTE — Progress Notes (Signed)
Pt d/c to home. VSS and pt in no pain or distress. Pt signed e-signature and was escorted to front lobby via wheelchair. Pt given taxi voucher and [employer] picking up pt.

## 2014-09-08 NOTE — ED Notes (Signed)
Patient reminded we are in need of urine sample.

## 2015-01-11 DIAGNOSIS — M25552 Pain in left hip: Secondary | ICD-10-CM | POA: Diagnosis not present

## 2015-01-11 DIAGNOSIS — M25551 Pain in right hip: Secondary | ICD-10-CM | POA: Diagnosis not present

## 2015-07-12 DIAGNOSIS — M1611 Unilateral primary osteoarthritis, right hip: Secondary | ICD-10-CM | POA: Diagnosis not present

## 2015-07-12 DIAGNOSIS — M25551 Pain in right hip: Secondary | ICD-10-CM | POA: Diagnosis not present

## 2015-07-12 DIAGNOSIS — M25552 Pain in left hip: Secondary | ICD-10-CM | POA: Diagnosis not present

## 2015-07-12 DIAGNOSIS — M1612 Unilateral primary osteoarthritis, left hip: Secondary | ICD-10-CM | POA: Diagnosis not present

## 2016-07-26 ENCOUNTER — Ambulatory Visit (INDEPENDENT_AMBULATORY_CARE_PROVIDER_SITE_OTHER): Payer: Medicare Other | Admitting: Orthopaedic Surgery

## 2016-07-26 ENCOUNTER — Ambulatory Visit (INDEPENDENT_AMBULATORY_CARE_PROVIDER_SITE_OTHER): Payer: Medicare Other

## 2016-07-26 DIAGNOSIS — M5416 Radiculopathy, lumbar region: Secondary | ICD-10-CM

## 2016-07-26 DIAGNOSIS — Z96641 Presence of right artificial hip joint: Secondary | ICD-10-CM | POA: Diagnosis not present

## 2016-07-26 DIAGNOSIS — M25551 Pain in right hip: Secondary | ICD-10-CM

## 2016-07-26 DIAGNOSIS — Z96642 Presence of left artificial hip joint: Secondary | ICD-10-CM | POA: Diagnosis not present

## 2016-07-26 MED ORDER — MELOXICAM 15 MG PO TABS: 15.0000 mg | ORAL_TABLET | Freq: Every day | ORAL | 0 refills | Status: DC

## 2016-07-26 MED ORDER — TIZANIDINE HCL 4 MG PO TABS: 4.0000 mg | ORAL_TABLET | Freq: Three times a day (TID) | ORAL | 0 refills | Status: DC | PRN

## 2016-07-26 NOTE — Progress Notes (Signed)
Office Visit Note   Patient: Eric Wood           Date of Birth: [birth year]           MRN: [id number] Visit Date: 07/26/2016              Requested by: No referring provider defined for this encounter. PCP: System, Provider Not In   Assessment & Plan: Visit Diagnoses:  1. Radiculopathy, lumbar region   2. Pain in right hip   3. Status post total replacement of right hip   4. Status post total replacement of left hip     Plan: Hopefully his symptoms will be short lived. I will put him on meloxicam and a muscle relaxant. We will over his x-rays and all questions were encouraged and answered. He will follow up as needed. If things worsen in any way he knows to give [place] a call.  Follow-Up Instructions: Return if symptoms worsen or fail to improve.   Orders:  Orders Placed This Encounter  Procedures  . XR HIP UNILAT W OR W/O PELVIS 1V RIGHT  . XR Lumbar Spine 2-3 Views   Meds ordered this encounter  Medications  . tiZANidine (ZANAFLEX) 4 MG tablet    Sig: Take 1 tablet (4 mg total) by mouth every 8 (eight) hours as needed for muscle spasms.    Dispense:  60 tablet    Refill:  0  . meloxicam (MOBIC) 15 MG tablet    Sig: Take 1 tablet (15 mg total) by mouth daily.    Dispense:  30 tablet    Refill:  0      Procedures: No procedures performed   Clinical Data: No additional findings.   Subjective: No chief complaint on file. The patient is well-known to me. Several years ago we performed a hip replacement on him due to severe disease in his hips. His right total hip was done 2 years ago in the left total hip 23 months ago. Both had severe disease. 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 He said he's been having some pain in his right groin from what he feels like may have been a pulled muscle. He's been having some low back pain as well. He denies any radicular symptoms going down his leg. He says his leg "just got stuck". He denies any significant symptoms today. He is not  taking any pain medications or anti-inflammatories. He is a diabetic and reports poor blood glucose control recently.  HPI  Review of Systems He denies any headache, chest pain, short of breath, fever, chills, nausea, vomiting  Objective: Vital Signs: There were no vitals taken for this visit.  Physical Exam He is alert and oriented 3 in no acute distress Ortho Exam He ambulates without assistive device was quite slow and his mobility. I can put both hips to painless range of motion in the range motion is full. Specialty Comments:  No specialty comments available.  Imaging: Xr Hip Unilat W Or W/o Pelvis 1v Right  Result Date: 07/26/2016 An AP pelvis shows bilateral total hip arthroplasties with no evidence of loosening. There is significant heterotopic ossification around both hips.  Xr Lumbar Spine 2-3 Views  Result Date: 07/26/2016 2 views of the lumbar spine show severe degenerative disc disease at L5 on S1. There is no acute findings otherwise in the lumbar lordosis is well-maintained.    PMFS History: Patient Active Problem List   Diagnosis Date Noted  . Mood disorder ([place]) 09/08/2014  .  Osteoarthritis of left hip 08/20/2014  . 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 Osteoarthritis of right hip 06/11/2014  . Status post total replacement of right hip 06/11/2014   Past Medical History:  Diagnosis Date  . Arthritis   . Back pain    acupuncture  . Diabetes mellitus    type 2  . Family history of adverse reaction to anesthesia    Severe PONV  . Hypercholesteremia    under control  . Hypertension   . Sleep apnea    sometimes wear CPAP     No family history on file.  Past Surgical History:  Procedure Laterality Date  . acupuncture     For back and hip  . COLONOSCOPY W/ POLYPECTOMY    . NO PAST SURGERIES    . TOTAL HIP ARTHROPLASTY Right 06/11/2014   Procedure: RIGHT TOTAL HIP ARTHROPLASTY ANTERIOR APPROACH;  Surgeon: Mcarthur Rossetti,  MD;  Location: [facility];  Service: Orthopedics;  Laterality: Right;  . TOTAL HIP ARTHROPLASTY Left 08/20/2014   Procedure: LEFT TOTAL HIP ARTHROPLASTY ANTERIOR APPROACH;  Surgeon: Mcarthur Rossetti, MD;  Location: [facility];  Service: Orthopedics;  Laterality: Left;   Social History   Occupational History  . Not on file.   Social History Main Topics  . Smoking status: Never Smoker  . Smokeless tobacco: Never Used  . Alcohol use Yes     Comment: occ beer   . Drug use: No     Comment: last time 1-2 years ago  . Sexual activity: Not Currently

## 2016-08-17 ENCOUNTER — Other Ambulatory Visit (INDEPENDENT_AMBULATORY_CARE_PROVIDER_SITE_OTHER): Payer: Self-pay | Admitting: Orthopaedic Surgery

## 2016-08-17 NOTE — Telephone Encounter (Signed)
Please advise 

## 2016-12-13 ENCOUNTER — Ambulatory Visit: Payer: Medicare Other | Admitting: Podiatry

## 2016-12-28 ENCOUNTER — Other Ambulatory Visit: Payer: Self-pay | Admitting: Podiatry

## 2016-12-28 ENCOUNTER — Ambulatory Visit (INDEPENDENT_AMBULATORY_CARE_PROVIDER_SITE_OTHER): Payer: Medicare Other | Admitting: Podiatry

## 2016-12-28 ENCOUNTER — Encounter: Payer: Self-pay | Admitting: Podiatry

## 2016-12-28 VITALS — BP 139/87 | HR 84 | Ht 70.0 in | Wt 226.0 lb

## 2016-12-28 DIAGNOSIS — M79676 Pain in unspecified toe(s): Secondary | ICD-10-CM

## 2016-12-28 DIAGNOSIS — B351 Tinea unguium: Secondary | ICD-10-CM

## 2016-12-28 DIAGNOSIS — E119 Type 2 diabetes mellitus without complications: Secondary | ICD-10-CM | POA: Diagnosis not present

## 2016-12-28 DIAGNOSIS — D229 Melanocytic nevi, unspecified: Secondary | ICD-10-CM | POA: Diagnosis not present

## 2016-12-28 DIAGNOSIS — L989 Disorder of the skin and subcutaneous tissue, unspecified: Secondary | ICD-10-CM

## 2016-12-28 NOTE — Progress Notes (Signed)
Subjective:    Patient ID: Eric Wood, male    DOB: 11-04-1955, 61 y.o.   MRN: [id number]  HPI  Chief Complaint  Patient presents with  . Nail Problem    bilateral elongated discolored toenails - small black spot just below nail left great toe  . Diabetes    last A1C was 9+   61 y.o. male presents with the above complaint.  His diabetes with last A1c greater than 9.  Also reports spot on left big toe for which she is concerned about.  States that it is only been present for about 3 years and has grown.  Unsure how else the lesion has changed over the years.  Past Medical History:  Diagnosis Date  . Arthritis   . Back pain    acupuncture  . Diabetes mellitus    type 2  . Family history of adverse reaction to anesthesia    Severe PONV  . Hypercholesteremia    under control  . Hypertension   . Sleep apnea    sometimes wear CPAP    Past Surgical History:  Procedure Laterality Date  . acupuncture     For back and hip  . COLONOSCOPY W/ POLYPECTOMY    . NO PAST SURGERIES    . TOTAL HIP ARTHROPLASTY Right 06/11/2014   Procedure: RIGHT TOTAL HIP ARTHROPLASTY ANTERIOR APPROACH;  Surgeon: Mcarthur Rossetti, MD;  Location: [facility];  Service: Orthopedics;  Laterality: Right;  . TOTAL HIP ARTHROPLASTY Left 08/20/2014   Procedure: LEFT TOTAL HIP ARTHROPLASTY ANTERIOR APPROACH;  Surgeon: Mcarthur Rossetti, MD;  Location: [facility];  Service: Orthopedics;  Laterality: Left;    Current Outpatient Medications:  .  acetaminophen (TYLENOL) 325 MG tablet, Take 325 mg by mouth every 6 (six) hours as needed for moderate pain., Disp: , Rfl:  .  amitriptyline (ELAVIL) 25 MG tablet, Take 25 mg by mouth at bedtime., Disp: , Rfl:  .  aspirin EC 325 MG EC tablet, Take 1 tablet (325 mg total) by mouth 2 (two) times daily after a meal., Disp: 30 tablet, Rfl: 0 .  atenolol (TENORMIN) 25 MG tablet, Take 25 mg by mouth every morning. , Disp: , Rfl:  .  glipiZIDE (GLUCOTROL) 10 MG tablet, Take 10 mg  by mouth 2 (two) times daily before a meal., Disp: , Rfl:  .  lisinopril (PRINIVIL,ZESTRIL) 10 MG tablet, Take 20 mg by mouth every morning. , Disp: , Rfl:  .  meloxicam (MOBIC) 15 MG tablet, Take 1 tablet (15 mg total) by mouth daily., Disp: 30 tablet, Rfl: 0 .  metFORMIN (GLUCOPHAGE) 500 MG tablet, Take 500 mg by mouth 2 (two) times daily with a meal., Disp: , Rfl:  .  PRESCRIPTION MEDICATION, Apply 1 application topically 2 (two) times daily as needed (dry skin). Lotion he receives from the [place], Disp: , Rfl:  .  simvastatin (ZOCOR) 10 MG tablet, Take 10 mg by mouth at bedtime., Disp: , Rfl:  .  tiZANidine (ZANAFLEX) 4 MG tablet, TAKE 1 TABLET (4 MG TOTAL) BY MOUTH EVERY 8 (EIGHT) HOURS AS NEEDED FOR MUSCLE SPASMS., Disp: 60 tablet, Rfl: 0 .  traMADol (ULTRAM) 50 MG tablet, Take 50 mg by mouth 2 (two) times daily as needed for moderate pain., Disp: , Rfl:  .  UNABLE TO FIND, C Pap, Disp: , Rfl:   No Active Allergies   Review of Systems  All other systems reviewed and are negative.     Objective:  Physical Exam Vitals:   12/28/16 0851  BP: 139/87  Pulse: 84   General AA&O x3. Normal mood and affect.  Vascular Dorsalis pedis and posterior tibial pulses  present 2+ bilaterally  Capillary refill normal to all digits. Pedal hair growth normal.  Neurologic Epicritic sensation grossly present.  Dermatologic No open lesions. Interspaces clear of maceration. Nails elongated and thickened with nail dystrophy and yellow discoloration Nevus left great toe with surrounding patchy brown discoloration  Orthopedic: MMT 5/5 in dorsiflexion, plantarflexion, inversion, and eversion. Normal joint ROM without pain or crepitus.      Assessment & Plan:  Patient was evaluated and treated and all questions answered.  Diabetes with Onychomycosis -Educated on diabetic footcare. Diabetic risk level 0 -Nails x10 debrided sharply and manually with large nail nipper and rotary burr.   Skin Lesion L  Great Toe / Nevus -Shave biopsy performed as below.  Procedure: Shave biopsy L great toe Anesthesia: Lidocaine 1% with epinephrine 2 mL Instrumentation: 15 blade Technique: After raising a wheal with local anesthetic and prepping the toe.  A shave biopsy was performed with a 15 blade Dressing: Dry, sterile, compression dressing. Disposition: Patient tolerated procedure well. Patient to return in 4 week for follow-up.

## 2017-01-02 LAB — TISSUE SPECIMEN

## 2017-01-02 LAB — PATHOLOGY

## 2017-01-07 ENCOUNTER — Telehealth: Payer: Self-pay | Admitting: Podiatry

## 2017-01-07 NOTE — Telephone Encounter (Signed)
Called patient to discuss biopsy results. Will have patient come in for re-excision. Advised this Thursday but patient cannot do this date. Patient requested next week - will move up appointment to the 17th. Forwarding message to schedulers to call patient for new appointment.

## 2017-01-17 ENCOUNTER — Ambulatory Visit (INDEPENDENT_AMBULATORY_CARE_PROVIDER_SITE_OTHER): Payer: Non-veteran care | Admitting: Podiatry

## 2017-01-17 DIAGNOSIS — L989 Disorder of the skin and subcutaneous tissue, unspecified: Secondary | ICD-10-CM | POA: Diagnosis not present

## 2017-01-17 NOTE — Addendum Note (Signed)
Addended by: Tomi Bamberger on: 01/17/2017 11:17 AM   Modules accepted: Orders

## 2017-01-17 NOTE — Progress Notes (Signed)
  Subjective:  Patient ID: Eric Wood, male    DOB: [birth year],  MRN: [id number]  Chief Complaint  Patient presents with  . Foot Ulcer    4 WK FUP AFTER BIOSPY  Juliann Pulse Authorization [id number] valid 12/03/2016 to 03/03/2017   62 y.o. male returns for the above complaint.  As discussed with patient over the phone the pathology for the biopsy showed some cellular atypia and recommended deeper biopsy.  Patient presents for rebiopsy today  Objective:  There were no vitals filed for this visit. General AA&O x3. Normal mood and affect.  Vascular Pedal pulses palpable.  Neurologic Epicritic sensation grossly intact.  Dermatologic Nevus L great toe, healing well post shave biopsy.  Orthopedic: No pain to palpation either foot.   Assessment & Plan:  Patient was evaluated and treated and all questions answered.  Procedure: Punch Biopsy Indication: Nevus L great toe Anesthesia: Lidocaine 1% with epinephrine 1 cc Instrumentation: 3 mm punch biopsy Technique: full thickness biopsy punch.  Dressing: Dry, sterile, compression dressing. Steri-strips. Specimen: Labeled and sent for pathology. Disposition: Patient tolerated procedure well. Leave dressing on for 48 hours. Thereafter dress with antibiotic ointment and band-aid. Off-loading pads dispensed. Patient to return in 1 week for follow-up.     -  Return in about 2 weeks (around 01/31/2017) for Biopsy f/u.

## 2017-01-18 ENCOUNTER — Telehealth: Payer: Self-pay | Admitting: *Deleted

## 2017-01-18 NOTE — Telephone Encounter (Signed)
Eric Wood Lab asked where specimen was taken for biopsy. I reviewed 01/18/2017 and the specimen was taken from the left great toe and I informed [name].

## 2017-01-21 LAB — PATHOLOGY

## 2017-01-21 LAB — TISSUE SPECIMEN

## 2017-01-25 ENCOUNTER — Ambulatory Visit: Payer: No Typology Code available for payment source | Admitting: Podiatry

## 2017-01-31 ENCOUNTER — Encounter: Payer: Self-pay | Admitting: Podiatry

## 2017-01-31 ENCOUNTER — Ambulatory Visit (INDEPENDENT_AMBULATORY_CARE_PROVIDER_SITE_OTHER): Payer: Medicare Other | Admitting: Podiatry

## 2017-01-31 VITALS — BP 134/83 | HR 96 | Temp 96.0°F

## 2017-01-31 DIAGNOSIS — L989 Disorder of the skin and subcutaneous tissue, unspecified: Secondary | ICD-10-CM | POA: Diagnosis not present

## 2017-01-31 DIAGNOSIS — D229 Melanocytic nevi, unspecified: Secondary | ICD-10-CM | POA: Diagnosis not present

## 2017-01-31 NOTE — Progress Notes (Signed)
  Subjective:  Patient ID: Eric Wood, male    DOB: October 26, 1955,  MRN: [id number]  Chief Complaint  Patient presents with  . Results    Left Hallux toe ulcer - review biopsy results    62 y.o. male returns for the above complaint.  Here for biopsy results.  States that the left toe is doing fine no pain or soreness  Objective:   Vitals:   01/31/17 0855  BP: 134/83  Pulse: 96  Temp: (!) 96 F (35.6 C)   General AA&O x3. Normal mood and affect.  Vascular Pedal pulses palpable.  Neurologic Epicritic sensation grossly intact.  Dermatologic Biopsy site healing well with small scab  Orthopedic: No pain to palpation either foot.   Assessment & Plan:  Patient was evaluated and treated and all questions answered.  Status post punch biopsy left hallux -Healing well, fibrosing and.  Medihoney applied with Band-Aid.  Patient to apply antibiotic ointment daily times 1 week -Results reviewed with patient junctional nevi without concerning for malignancy  Return if symptoms worsen or fail to improve.

## 2017-04-03 ENCOUNTER — Emergency Department (HOSPITAL_COMMUNITY)
Admission: EM | Admit: 2017-04-03 | Discharge: 2017-04-03 | Disposition: A | Discharge status: Home / Self Care | Payer: Medicare Other | Attending: Emergency Medicine | Admitting: Emergency Medicine

## 2017-04-03 ENCOUNTER — Encounter (HOSPITAL_COMMUNITY): Payer: Self-pay | Admitting: Emergency Medicine

## 2017-04-03 DIAGNOSIS — Z96643 Presence of artificial hip joint, bilateral: Secondary | ICD-10-CM | POA: Diagnosis not present

## 2017-04-03 DIAGNOSIS — F329 Major depressive disorder, single episode, unspecified: Secondary | ICD-10-CM

## 2017-04-03 DIAGNOSIS — F1420 Cocaine dependence, uncomplicated: Secondary | ICD-10-CM | POA: Insufficient documentation

## 2017-04-03 DIAGNOSIS — F331 Major depressive disorder, recurrent, moderate: Secondary | ICD-10-CM | POA: Diagnosis not present

## 2017-04-03 DIAGNOSIS — E119 Type 2 diabetes mellitus without complications: Secondary | ICD-10-CM | POA: Diagnosis not present

## 2017-04-03 DIAGNOSIS — I10 Essential (primary) hypertension: Secondary | ICD-10-CM | POA: Insufficient documentation

## 2017-04-03 DIAGNOSIS — Z7984 Long term (current) use of oral hypoglycemic drugs: Secondary | ICD-10-CM | POA: Insufficient documentation

## 2017-04-03 DIAGNOSIS — R4589 Other symptoms and signs involving emotional state: Secondary | ICD-10-CM

## 2017-04-03 DIAGNOSIS — Z79899 Other long term (current) drug therapy: Secondary | ICD-10-CM | POA: Insufficient documentation

## 2017-04-03 LAB — CBG MONITORING, ED
GLUCOSE-CAPILLARY: 313 mg/dL — AB (ref 65–99)
Glucose-Capillary: 380 mg/dL — ABNORMAL HIGH (ref 65–99)
Glucose-Capillary: 453 mg/dL — ABNORMAL HIGH (ref 65–99)

## 2017-04-03 LAB — RAPID URINE DRUG SCREEN, HOSP PERFORMED
Amphetamines: NOT DETECTED
BARBITURATES: NOT DETECTED
Benzodiazepines: NOT DETECTED
Cocaine: POSITIVE — AB
OPIATES: NOT DETECTED
TETRAHYDROCANNABINOL: NOT DETECTED

## 2017-04-03 LAB — COMPREHENSIVE METABOLIC PANEL
ALK PHOS: 113 U/L (ref 38–126)
ALT: 24 U/L (ref 17–63)
AST: 17 U/L (ref 15–41)
Albumin: 3.8 g/dL (ref 3.5–5.0)
Anion gap: 10 (ref 5–15)
BUN: 13 mg/dL (ref 6–20)
CALCIUM: 9.2 mg/dL (ref 8.9–10.3)
CO2: 23 mmol/L (ref 22–32)
CREATININE: 1.1 mg/dL (ref 0.61–1.24)
Chloride: 102 mmol/L (ref 101–111)
GFR calc non Af Amer: >60 mL/min (ref >60)
Glucose, Bld: 469 mg/dL — ABNORMAL HIGH (ref 65–99)
Potassium: 3.8 mmol/L (ref 3.5–5.1)
SODIUM: 135 mmol/L (ref 135–145)
Total Bilirubin: 0.9 mg/dL (ref 0.3–1.2)
Total Protein: 7.8 g/dL (ref 6.5–8.1)

## 2017-04-03 LAB — URINALYSIS, ROUTINE W REFLEX MICROSCOPIC
BILIRUBIN URINE: NEGATIVE
Bacteria, UA: NONE SEEN
Glucose, UA: >=500 mg/dL — AB
HGB URINE DIPSTICK: NEGATIVE
KETONES UR: 5 mg/dL — AB
LEUKOCYTES UA: NEGATIVE
NITRITE: NEGATIVE
PH: 6 (ref 5.0–8.0)
Protein, ur: NEGATIVE mg/dL
RBC / HPF: NONE SEEN RBC/hpf (ref 0–5)
Specific Gravity, Urine: 1.03 (ref 1.005–1.030)

## 2017-04-03 LAB — CBC
HEMATOCRIT: 38.3 % — AB (ref 39.0–52.0)
HEMOGLOBIN: 12.9 g/dL — AB (ref 13.0–17.0)
MCH: 29.5 pg (ref 26.0–34.0)
MCHC: 33.7 g/dL (ref 30.0–36.0)
MCV: 87.4 fL (ref 78.0–100.0)
Platelets: 344 10*3/uL (ref 150–400)
RBC: 4.38 MIL/uL (ref 4.22–5.81)
RDW: 12.9 % (ref 11.5–15.5)
WBC: 5.8 10*3/uL (ref 4.0–10.5)

## 2017-04-03 LAB — SALICYLATE LEVEL

## 2017-04-03 LAB — ETHANOL: Alcohol, Ethyl (B): <10 mg/dL (ref <10)

## 2017-04-03 LAB — ACETAMINOPHEN LEVEL: Acetaminophen (Tylenol), Serum: <10 ug/mL — ABNORMAL LOW (ref 10–30)

## 2017-04-03 MED ORDER — SIMVASTATIN 10 MG PO TABS: 10.0000 mg | ORAL_TABLET | Freq: Every day | ORAL | Status: DC

## 2017-04-03 MED ORDER — METFORMIN HCL 500 MG PO TABS
500.0000 mg | ORAL_TABLET | Freq: Two times a day (BID) | ORAL | Status: DC
  Administered 2017-04-03: 1000 mg via ORAL
  Filled 2017-04-03: qty 1

## 2017-04-03 MED ORDER — SERTRALINE HCL 50 MG PO TABS
25.0000 mg | ORAL_TABLET | Freq: Every day | ORAL | Status: DC
  Administered 2017-04-03: 25 mg via ORAL
  Filled 2017-04-03: qty 1

## 2017-04-03 MED ORDER — ACETAMINOPHEN 325 MG PO TABS: 325.0000 mg | ORAL_TABLET | Freq: Four times a day (QID) | ORAL | Status: DC | PRN

## 2017-04-03 MED ORDER — ATENOLOL 25 MG PO TABS
25.0000 mg | ORAL_TABLET | Freq: Every morning | ORAL | Status: DC
  Administered 2017-04-03: 25 mg via ORAL
  Filled 2017-04-03: qty 1

## 2017-04-03 MED ORDER — SODIUM CHLORIDE 0.9 % IV BOLUS
2000.0000 mL | Freq: Once | INTRAVENOUS | Status: AC
  Administered 2017-04-03: 2000 mL via INTRAVENOUS

## 2017-04-03 MED ORDER — METFORMIN HCL 500 MG PO TABS
1000.0000 mg | ORAL_TABLET | Freq: Two times a day (BID) | ORAL | Status: DC
  Filled 2017-04-03: qty 2

## 2017-04-03 MED ORDER — LISINOPRIL 20 MG PO TABS
20.0000 mg | ORAL_TABLET | Freq: Every morning | ORAL | Status: DC
  Administered 2017-04-03: 20 mg via ORAL
  Filled 2017-04-03: qty 1

## 2017-04-03 MED ORDER — GABAPENTIN 300 MG PO CAPS
300.0000 mg | ORAL_CAPSULE | Freq: Two times a day (BID) | ORAL | Status: DC
  Administered 2017-04-03 (×2): 300 mg via ORAL
  Filled 2017-04-03 (×2): qty 1

## 2017-04-03 MED ORDER — GLIPIZIDE 10 MG PO TABS
10.0000 mg | ORAL_TABLET | Freq: Two times a day (BID) | ORAL | Status: DC
  Administered 2017-04-03: 10 mg via ORAL
  Filled 2017-04-03: qty 1

## 2017-04-03 MED ORDER — INSULIN ASPART 100 UNIT/ML ~~LOC~~ SOLN: 0.0000 [IU] | Freq: Three times a day (TID) | SUBCUTANEOUS | Status: DC

## 2017-04-03 NOTE — ED Notes (Signed)
Wanded by security 

## 2017-04-03 NOTE — ED Notes (Signed)
After talking with SW, pt is wanting to go to [place] for continued care. He has funds to get there via bus if we can arrange discharge and bus pass downtown. SW will provide bus pass. Dr Wilson Singer aware and in agreement to discharge pt.

## 2017-04-03 NOTE — ED Notes (Signed)
Social worker discussing pts needs for discharge via phone with pt at present time.

## 2017-04-03 NOTE — ED Notes (Signed)
Bed: [facility] Expected date:  Expected time:  Means of arrival:  Comments: 
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
## 2017-04-03 NOTE — BH Assessment (Signed)
[facility] Assessment Progress Note  Per Patriciaann Clan, PA, this pt does not require psychiatric hospitalization at this time.  Pt is psychiatrically cleared.  Discharge instructions include referral information for area [facility] services.  Social worker will be asked to address pt's other psychosocial needs.  Pt's nurse, Chong Sicilian, has been notified.  Jalene Mullet, [place] Triage Specialist [phone]
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
## 2017-04-03 NOTE — Discharge Instructions (Signed)
For your behavioral health needs, you are advised to follow up with the [facility]:       [facility]      [place], [place] [address]      [phone]  For other supportive services for veterans in the [place] area, contact the [place]:       [facility]      [address] [place], [place] [address]      [phone]      Hours of operation: 8:00 am - 7:00 pm, Monday - Friday  For crisis services for veterans, call the [employer], available 24 hours a day, 7 days a week:       [employer]      [phone]

## 2017-04-05 IMAGING — CR DG HIP (WITH OR WITHOUT PELVIS) 1V PORT*R*
1 series · 2 of 2 positions shown · non-contrast
Comparison: None.

CLINICAL DATA: Right hip osteoarthritis. Status post right hip
replacement.

EXAM:
RIGHT HIP (WITH PELVIS) 1 VIEW PORTABLE

[Series 1: AP · U · 2 of 2 slices shown]
[im 1/2]
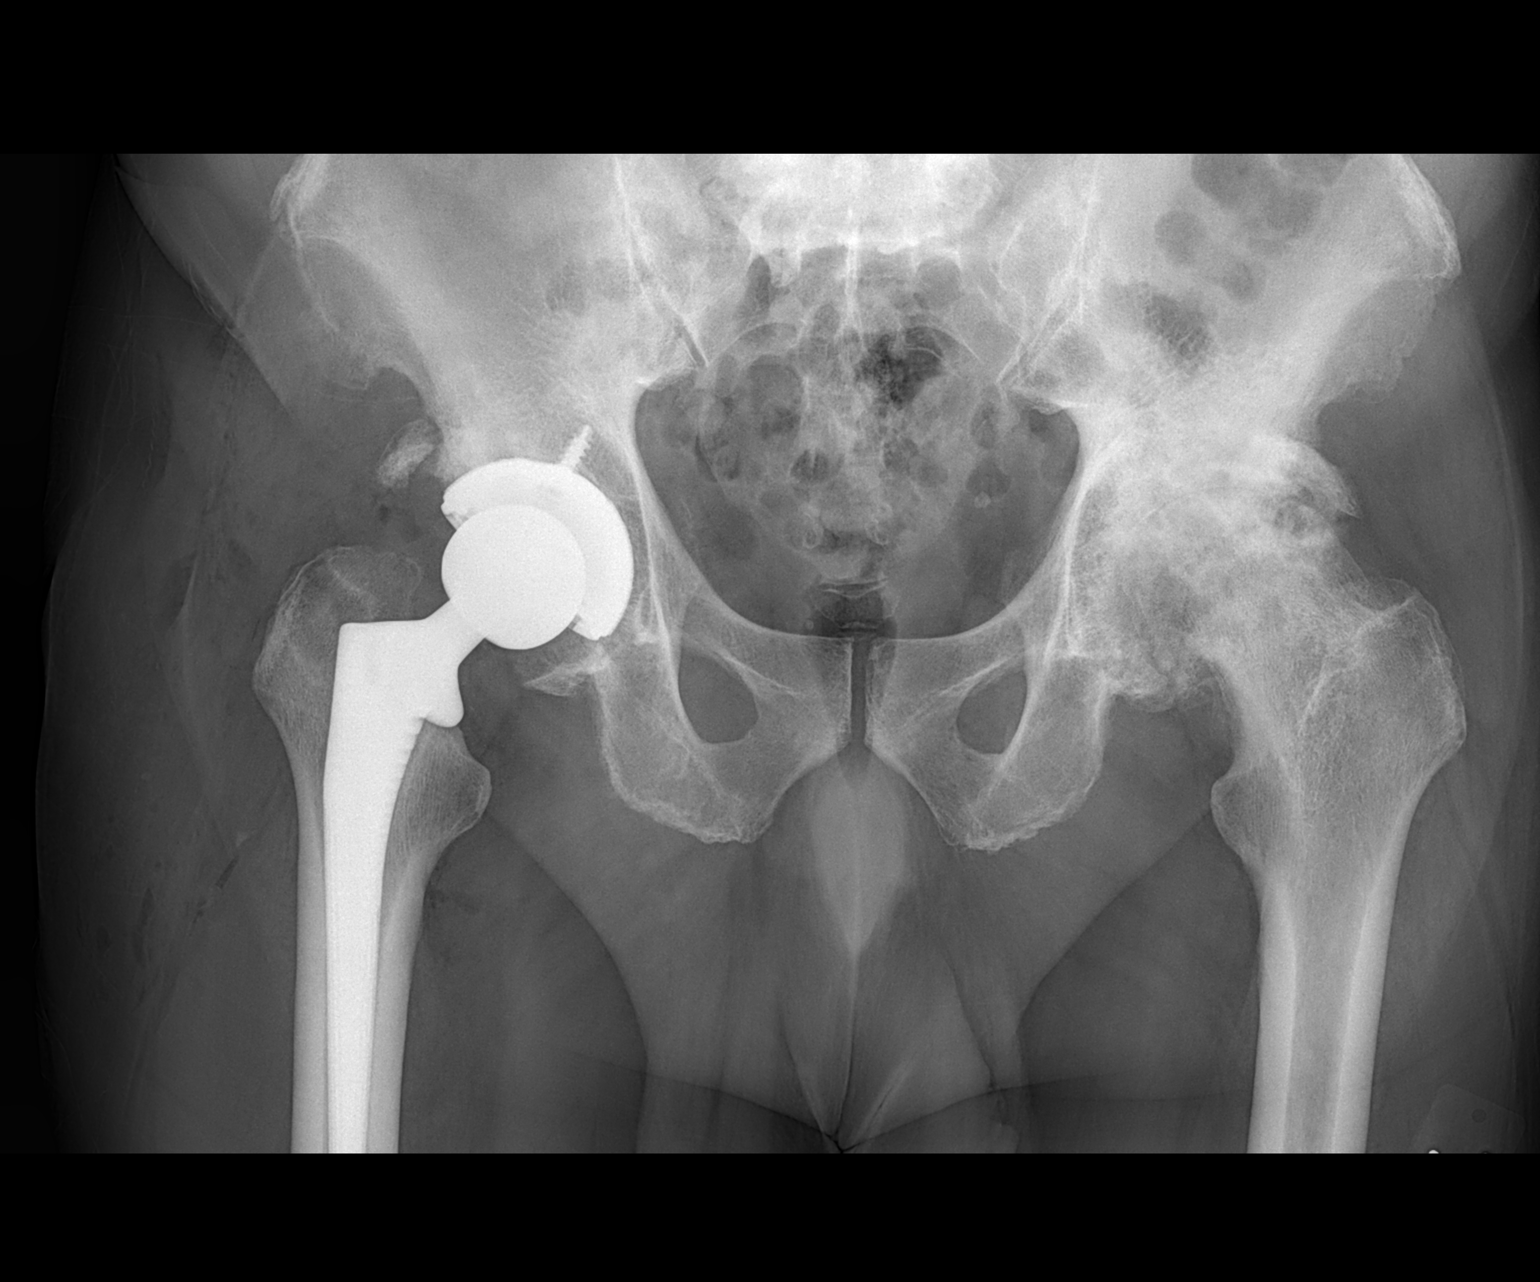
[im 2/2]
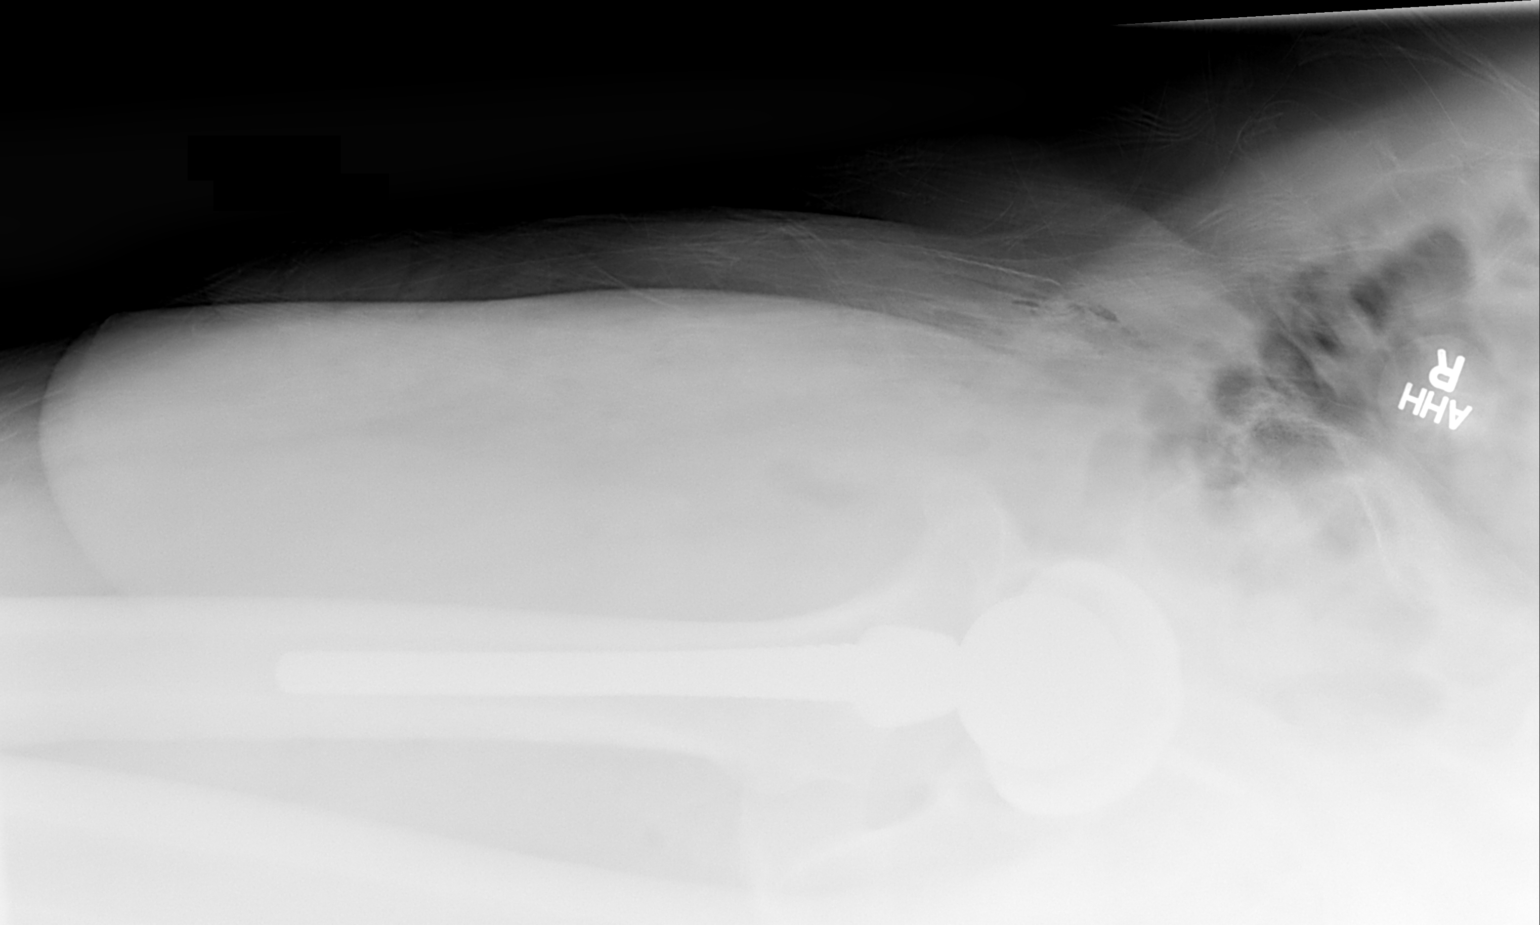

[2 of 2 positions shown; findings below may reference images not displayed]

FINDINGS: Bipolar right hip prosthesis is seen in appropriate position. No
evidence of fracture or dislocation.

Severe end-stage left hip osteoarthritis noted as well as lower
lumbar spine degenerative changes.
IMPRESSION: Expected postoperative appearance of right hip prosthesis. No
evidence of fracture or other complication.

Severe end-stage left hip osteoarthritis and lower lumbar
spondylosis.

## 2018-03-07 DIAGNOSIS — F1410 Cocaine abuse, uncomplicated: Secondary | ICD-10-CM | POA: Diagnosis not present

## 2018-03-07 DIAGNOSIS — Z1159 Encounter for screening for other viral diseases: Secondary | ICD-10-CM | POA: Diagnosis not present

## 2018-03-07 DIAGNOSIS — I10 Essential (primary) hypertension: Secondary | ICD-10-CM | POA: Diagnosis not present

## 2018-03-07 DIAGNOSIS — Z008 Encounter for other general examination: Secondary | ICD-10-CM | POA: Diagnosis not present

## 2018-03-07 DIAGNOSIS — Z0000 Encounter for general adult medical examination without abnormal findings: Secondary | ICD-10-CM | POA: Diagnosis not present

## 2018-03-07 DIAGNOSIS — Z794 Long term (current) use of insulin: Secondary | ICD-10-CM | POA: Diagnosis not present

## 2018-03-07 DIAGNOSIS — F329 Major depressive disorder, single episode, unspecified: Secondary | ICD-10-CM | POA: Diagnosis not present

## 2018-03-07 DIAGNOSIS — E669 Obesity, unspecified: Secondary | ICD-10-CM | POA: Diagnosis not present

## 2018-03-07 DIAGNOSIS — E1142 Type 2 diabetes mellitus with diabetic polyneuropathy: Secondary | ICD-10-CM | POA: Diagnosis not present

## 2018-08-11 ENCOUNTER — Other Ambulatory Visit: Payer: Self-pay

## 2018-08-11 ENCOUNTER — Encounter: Payer: Self-pay | Admitting: Podiatry

## 2018-08-11 ENCOUNTER — Ambulatory Visit: Payer: Medicare HMO | Admitting: Podiatry

## 2018-08-11 VITALS — Temp 97.7°F

## 2018-08-11 DIAGNOSIS — B351 Tinea unguium: Secondary | ICD-10-CM

## 2018-08-11 DIAGNOSIS — E119 Type 2 diabetes mellitus without complications: Secondary | ICD-10-CM | POA: Diagnosis not present

## 2018-08-11 DIAGNOSIS — Q828 Other specified congenital malformations of skin: Secondary | ICD-10-CM

## 2018-08-11 DIAGNOSIS — B353 Tinea pedis: Secondary | ICD-10-CM

## 2018-08-11 MED ORDER — CLOTRIMAZOLE 1 % EX CREA
TOPICAL_CREAM | CUTANEOUS | 1 refills | Status: AC
Start: 2018-08-11 — End: ?

## 2018-08-11 NOTE — Patient Instructions (Addendum)
Athlete's Foot  Athlete's foot (tinea pedis) is a fungal infection of the skin on your feet. It often occurs on the skin that is between or underneath the toes. It can also occur on the soles of your feet. The infection can spread from person to person (is contagious). It can also spread when a person's bare feet come in contact with the fungus on shower floors or on items such as shoes. What are the causes? This condition is caused by a fungus that grows in warm, moist places. You can get athlete's foot by sharing shoes, shower stalls, towels, and wet floors with someone who is infected. Not washing your feet or changing your socks often enough can also lead to athlete's foot. What increases the risk? This condition is more likely to develop in:  Men.  People who have a weak body defense system (immune system).  People who have diabetes.  People who use public showers, such as at a gym.  People who wear heavy-duty shoes, such as industrial or military shoes.  Seasons with warm, humid weather. What are the signs or symptoms? Symptoms of this condition include:  Itchy areas between your toes or on the soles of your feet.  White, flaky, or scaly areas between your toes or on the soles of your feet.  Very itchy small blisters between your toes or on the soles of your feet.  Small cuts in your skin. These cuts can become infected.  Thick or discolored toenails. How is this diagnosed? This condition may be diagnosed with a physical exam and a review of your medical history. Your health care provider may also take a skin or toenail sample to examine under a microscope. How is this treated? This condition is treated with antifungal medicines. These may be applied as powders, ointments, or creams. In severe cases, an oral antifungal medicine may be given. Follow these instructions at home: Medicines  Apply or take over-the-counter and prescription medicines only as told by your health  care provider.  Apply your antifungal medicine as told by your health care provider. Do not stop using the antifungal even if your condition improves. Foot care  Do not scratch your feet.  Keep your feet dry: ? Wear cotton or wool socks. Change your socks every day or if they become wet. ? Wear shoes that allow air to flow, such as sandals or canvas tennis shoes.  Wash and dry your feet, including the area between your toes. Also, wash and dry your feet: ? Every day or as told by your health care provider. ? After exercising. General instructions  Do not let others use towels, shoes, nail clippers, or other personal items that touch your feet.  Protect your feet by wearing sandals in wet areas, such as locker rooms and shared showers.  Keep all follow-up visits as told by your health care provider. This is important.  If you have diabetes, keep your blood sugar under control. Contact a health care provider if:  You have a fever.  You have swelling, soreness, warmth, or redness in your foot.  Your feet are not getting better with treatment.  Your symptoms get worse.  You have new symptoms. Summary  Athlete's foot (tinea pedis) is a fungal infection of the skin on your feet. It often occurs on skin that is between or underneath the toes.  This condition is caused by a fungus that grows in warm, moist places.  Symptoms include white, flaky, or scaly areas between   your toes or on the soles of your feet.  This condition is treated with antifungal medicines.  Keep your feet clean. Always dry them thoroughly. This information is not intended to replace advice given to you by your health care provider. Make sure you discuss any questions you have with your health care provider. Document Released: 12/16/1999 Document Revised: 12/13/2016 Document Reviewed: 10/08/2016 Elsevier Patient Education  2020 [employer]   Diabetes Mellitus and Foot Care Foot care is an important  part of your health, especially when you have diabetes. Diabetes may cause you to have problems because of poor blood flow (circulation) to your feet and legs, which can cause your skin to:  Become thinner and drier.  Break more easily.  Heal more slowly.  Peel and crack. You may also have nerve damage (neuropathy) in your legs and feet, causing decreased feeling in them. This means that you may not notice minor injuries to your feet that could lead to more serious problems. Noticing and addressing any potential problems early is the best way to prevent future foot problems. How to care for your feet Foot hygiene  Wash your feet daily with warm water and mild soap. Do not use hot water. Then, pat your feet and the areas between your toes until they are completely dry. Do not soak your feet as this can dry your skin.  Trim your toenails straight across. Do not dig under them or around the cuticle. File the edges of your nails with an emery board or nail file.  Apply a moisturizing lotion or petroleum jelly to the skin on your feet and to dry, brittle toenails. Use lotion that does not contain alcohol and is unscented. Do not apply lotion between your toes. Shoes and socks  Wear clean socks or stockings every day. Make sure they are not too tight. Do not wear knee-high stockings since they may decrease blood flow to your legs.  Wear shoes that fit properly and have enough cushioning. Always look in your shoes before you put them on to be sure there are no objects inside.  To break in new shoes, wear them for just a few hours a day. This prevents injuries on your feet. Wounds, scrapes, corns, and calluses  Check your feet daily for blisters, cuts, bruises, sores, and redness. If you cannot see the bottom of your feet, use a mirror or ask someone for help.  Do not cut corns or calluses or try to remove them with medicine.  If you find a minor scrape, cut, or break in the skin on your feet,  keep it and the skin around it clean and dry. You may clean these areas with mild soap and water. Do not clean the area with peroxide, alcohol, or iodine.  If you have a wound, scrape, corn, or callus on your foot, look at it several times a day to make sure it is healing and not infected. Check for: ? Redness, swelling, or pain. ? Fluid or blood. ? Warmth. ? Pus or a bad smell. General instructions  Do not cross your legs. This may decrease blood flow to your feet.  Do not use heating pads or hot water bottles on your feet. They may burn your skin. If you have lost feeling in your feet or legs, you may not know this is happening until it is too late.  Protect your feet from hot and cold by wearing shoes, such as at the beach or on hot pavement.    Schedule a complete foot exam at least once a year (annually) or more often if you have foot problems. If you have foot problems, report any cuts, sores, or bruises to your health care provider immediately. Contact a health care provider if:  You have a medical condition that increases your risk of infection and you have any cuts, sores, or bruises on your feet.  You have an injury that is not healing.  You have redness on your legs or feet.  You feel burning or tingling in your legs or feet.  You have pain or cramps in your legs and feet.  Your legs or feet are numb.  Your feet always feel cold.  You have pain around a toenail. Get help right away if:  You have a wound, scrape, corn, or callus on your foot and: ? You have pain, swelling, or redness that gets worse. ? You have fluid or blood coming from the wound, scrape, corn, or callus. ? Your wound, scrape, corn, or callus feels warm to the touch. ? You have pus or a bad smell coming from the wound, scrape, corn, or callus. ? You have a fever. ? You have a red line going up your leg. Summary  Check your feet every day for cuts, sores, red spots, swelling, and blisters.   Moisturize feet and legs daily.  Wear shoes that fit properly and have enough cushioning.  If you have foot problems, report any cuts, sores, or bruises to your health care provider immediately.  Schedule a complete foot exam at least once a year (annually) or more often if you have foot problems. This information is not intended to replace advice given to you by your health care provider. Make sure you discuss any questions you have with your health care provider. Document Released: 12/16/1999 Document Revised: 01/30/2017 Document Reviewed: 01/20/2016 Elsevier Patient Education  2020 [employer] Inc.   Onychomycosis/Fungal Toenails  WHAT IS IT? An infection that lies within the keratin of your nail plate that is caused by a fungus.  WHY ME? Fungal infections affect all ages, sexes, races, and creeds.  There may be many factors that predispose you to a fungal infection such as age, coexisting medical conditions such as diabetes, or an autoimmune disease; stress, medications, fatigue, genetics, etc.  Bottom line: fungus thrives in a warm, moist environment and your shoes offer such a location.  IS IT CONTAGIOUS? Theoretically, yes.  You do not want to share shoes, nail clippers or files with someone who has fungal toenails.  Walking around barefoot in the same room or sleeping in the same bed is unlikely to transfer the organism.  It is important to realize, however, that fungus can spread easily from one nail to the next on the same foot.  HOW DO WE TREAT THIS?  There are several ways to treat this condition.  Treatment may depend on many factors such as age, medications, pregnancy, liver and kidney conditions, etc.  It is best to ask your doctor which options are available to you.  1. No treatment.   Unlike many other medical concerns, you can live with this condition.  However for many people this can be a painful condition and may lead to ingrown toenails or a bacterial infection.  It is  recommended that you keep the nails cut short to help reduce the amount of fungal nail. 2. Topical treatment.  These range from herbal remedies to prescription strength nail lacquers.  About 40-50% effective, topicals require   daily application for approximately 9 to 12 months or until an entirely new nail has grown out.  The most effective topicals are medical grade medications available through physicians offices. 3. Oral antifungal medications.  With an 80-90% cure rate, the most common oral medication requires 3 to 4 months of therapy and stays in your system for a year as the new nail grows out.  Oral antifungal medications do require blood work to make sure it is a safe drug for you.  A liver function panel will be performed prior to starting the medication and after the first month of treatment.  It is important to have the blood work performed to avoid any harmful side effects.  In general, this medication safe but blood work is required. 4. Laser Therapy.  This treatment is performed by applying a specialized laser to the affected nail plate.  This therapy is noninvasive, fast, and non-painful.  It is not covered by insurance and is therefore, out of pocket.  The results have been very good with a 80-95% cure rate.  The [facility] is the only practice in the area to offer this therapy. 5. Permanent Nail Avulsion.  Removing the entire nail so that a new nail will not grow back.

## 2018-08-14 NOTE — Progress Notes (Signed)
Subjective:  Eric Wood presents to clinic today with cc of  painful, thick, discolored, elongated toenails 1-5 b/l that become tender and cannot cut because of thickness.  Pain is aggravated when wearing enclosed shoe gear.  He underwent biopsy of left hallux in January 2019 and results were discussed with Dr. March Rummage on last visit. Biopsy returned results of junctional nevi, no malignancy.  He states he went to the ED due to pain of right heel lesion and lesion was trimmed. He was concerned because he is diabetic. Xrays were negative.   No Known Allergies   Objective: Vitals:   08/11/18 1037  Temp: 97.7 F (36.5 C)    Physical Examination:  Vascular Examination: Capillary refill time immediate x 10 digits.  Palpable DP/PT pulses b/l.  Digital hair sparse b/l.  No edema noted b/l.  Skin temperature gradient WNL b/l.  Dermatological Examination: Skin with normal turgor, texture and tone b/l.  No open wounds b/l.  No interdigital macerations noted b/l.  Elongated, thick, discolored brittle toenails with subungual debris and pain on dorsal palpation of nailbeds 1-5 b/l.  Porokeratotic lesions plantar right heel with tenderness to palpation. No erythema, no edema, no drainage, no flocculence.  Diffuse scaling noted peripherally and plantarly b/l feet with mild foot odor.  No interdigital macerations.  No blisters, no weeping. No signs of secondary bacterial infection noted.  Musculoskeletal Examination: Muscle strength 5/5 to all muscle groups b/l  No pain, crepitus or joint discomfort with active/passive ROM.  Neurological Examination: Sensation intact 5/5 b/l with 10 gram monofilament.  Vibratory sensation intact b/l.  Proprioceptive sensation intact b/l.  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 Toenails 1-5 b/l were debrided in length and girth without iatrogenic  laceration. 2. Porokeratosis right heel pared and enucleated with sterile scalpel blade without incident. 3. For tinea pedis, Rx for Clotrimazole Cream 1% to be applied to both feet and between toes bid x 4 weeks. 4. Continue soft, supportive shoe gear daily. 5. Report any pedal injuries to medical professional. 6. Follow up 3 months. 7. Patient/POA to call should there be a question/concern in there interim.

## 2018-11-10 ENCOUNTER — Ambulatory Visit: Payer: Medicare HMO | Admitting: Podiatry

## 2018-11-14 ENCOUNTER — Other Ambulatory Visit: Payer: Self-pay

## 2018-11-14 ENCOUNTER — Encounter: Payer: Self-pay | Admitting: Podiatry

## 2018-11-14 ENCOUNTER — Ambulatory Visit: Payer: Medicare HMO | Admitting: Podiatry

## 2018-11-14 DIAGNOSIS — B351 Tinea unguium: Secondary | ICD-10-CM

## 2018-11-14 DIAGNOSIS — Q828 Other specified congenital malformations of skin: Secondary | ICD-10-CM

## 2018-11-14 DIAGNOSIS — E119 Type 2 diabetes mellitus without complications: Secondary | ICD-10-CM

## 2018-11-14 DIAGNOSIS — M79674 Pain in right toe(s): Secondary | ICD-10-CM | POA: Diagnosis not present

## 2018-11-14 DIAGNOSIS — M79675 Pain in left toe(s): Secondary | ICD-10-CM

## 2018-11-14 DIAGNOSIS — Z794 Long term (current) use of insulin: Secondary | ICD-10-CM

## 2018-11-14 NOTE — Patient Instructions (Signed)
Diabetes Mellitus and Foot Care Foot care is an important part of your health, especially when you have diabetes. Diabetes may cause you to have problems because of poor blood flow (circulation) to your feet and legs, which can cause your skin to:  Become thinner and drier.  Break more easily.  Heal more slowly.  Peel and crack. You may also have nerve damage (neuropathy) in your legs and feet, causing decreased feeling in them. This means that you may not notice minor injuries to your feet that could lead to more serious problems. Noticing and addressing any potential problems early is the best way to prevent future foot problems. How to care for your feet Foot hygiene  Wash your feet daily with warm water and mild soap. Do not use hot water. Then, pat your feet and the areas between your toes until they are completely dry. Do not soak your feet as this can dry your skin.  Trim your toenails straight across. Do not dig under them or around the cuticle. File the edges of your nails with an emery board or nail file.  Apply a moisturizing lotion or petroleum jelly to the skin on your feet and to dry, brittle toenails. Use lotion that does not contain alcohol and is unscented. Do not apply lotion between your toes. Shoes and socks  Wear clean socks or stockings every day. Make sure they are not too tight. Do not wear knee-high stockings since they may decrease blood flow to your legs.  Wear shoes that fit properly and have enough cushioning. Always look in your shoes before you put them on to be sure there are no objects inside.  To break in new shoes, wear them for just a few hours a day. This prevents injuries on your feet. Wounds, scrapes, corns, and calluses  Check your feet daily for blisters, cuts, bruises, sores, and redness. If you cannot see the bottom of your feet, use a mirror or ask someone for help.  Do not cut corns or calluses or try to remove them with medicine.  If you  find a minor scrape, cut, or break in the skin on your feet, keep it and the skin around it clean and dry. You may clean these areas with mild soap and water. Do not clean the area with peroxide, alcohol, or iodine.  If you have a wound, scrape, corn, or callus on your foot, look at it several times a day to make sure it is healing and not infected. Check for: ? Redness, swelling, or pain. ? Fluid or blood. ? Warmth. ? Pus or a bad smell. General instructions  Do not cross your legs. This may decrease blood flow to your feet.  Do not use heating pads or hot water bottles on your feet. They may burn your skin. If you have lost feeling in your feet or legs, you may not know this is happening until it is too late.  Protect your feet from hot and cold by wearing shoes, such as at the beach or on hot pavement.  Schedule a complete foot exam at least once a year (annually) or more often if you have foot problems. If you have foot problems, report any cuts, sores, or bruises to your health care provider immediately. Contact a health care provider if:  You have a medical condition that increases your risk of infection and you have any cuts, sores, or bruises on your feet.  You have an injury that is not   healing.  You have redness on your legs or feet.  You feel burning or tingling in your legs or feet.  You have pain or cramps in your legs and feet.  Your legs or feet are numb.  Your feet always feel cold.  You have pain around a toenail. Get help right away if:  You have a wound, scrape, corn, or callus on your foot and: ? You have pain, swelling, or redness that gets worse. ? You have fluid or blood coming from the wound, scrape, corn, or callus. ? Your wound, scrape, corn, or callus feels warm to the touch. ? You have pus or a bad smell coming from the wound, scrape, corn, or callus. ? You have a fever. ? You have a red line going up your leg. Summary  Check your feet every day  for cuts, sores, red spots, swelling, and blisters.  Moisturize feet and legs daily.  Wear shoes that fit properly and have enough cushioning.  If you have foot problems, report any cuts, sores, or bruises to your health care provider immediately.  Schedule a complete foot exam at least once a year (annually) or more often if you have foot problems. This information is not intended to replace advice given to you by your health care provider. Make sure you discuss any questions you have with your health care provider. Document Released: 12/16/1999 Document Revised: 01/30/2017 Document Reviewed: 01/20/2016 Elsevier Patient Education  2020 Elsevier Inc.  

## 2018-11-20 NOTE — Progress Notes (Signed)
Subjective: Eric Wood is seen today for follow up painful plantar lesion right heel and elongated, mycotic toenails that he cannot cut. Pain interferes with daily activities. Aggravating factor includes wearing enclosed shoe gear and relieved with periodic debridement.  Current Outpatient Medications on File Prior to Visit  Medication Sig  . acetaminophen (TYLENOL) 325 MG tablet Take 325 mg by mouth every 6 (six) hours as needed for moderate pain.  Marland Kitchen aspirin EC 325 MG EC tablet Take 1 tablet (325 mg total) by mouth 2 (two) times daily after a meal. (Patient not taking: Reported on 04/03/2017)  . atenolol (TENORMIN) 25 MG tablet Take 25 mg by mouth every morning.   . clotrimazole (LOTRIMIN) 1 % cream Apply to both feet and between toes twice daily for 4 weeks.  . gabapentin (NEURONTIN) 300 MG capsule Take 300 mg by mouth 2 (two) times daily.  Marland Kitchen glipiZIDE (GLUCOTROL) 10 MG tablet Take 10 mg by mouth 2 (two) times daily before a meal.  . hydrochlorothiazide (HYDRODIURIL) 25 MG tablet TAKE 1 TABLET BY MOUTH ONCE DAILY FOR HIGH BLOOD PRESSURE FOR 90 DAYS  . insulin glargine (LANTUS) 100 UNIT/ML injection Inject into the skin.  Marland Kitchen lisinopril (PRINIVIL,ZESTRIL) 10 MG tablet Take 20 mg by mouth every morning.   Marland Kitchen lisinopril (ZESTRIL) 40 MG tablet TAKE 1 TABLET BY MOUTH ONCE DAILY FOR HIGH BLOOD PRESSURE FOR 90 DAYS  . meloxicam (MOBIC) 15 MG tablet Take 1 tablet (15 mg total) by mouth daily. (Patient not taking: Reported on 04/03/2017)  . metFORMIN (GLUCOPHAGE) 500 MG tablet Take 500 mg by mouth 2 (two) times daily with a meal.  . PRESCRIPTION MEDICATION Apply 1 application topically 2 (two) times daily as needed (dry skin). Lotion he receives from the [place]  . senna (SENOKOT) 8.6 MG TABS tablet Take 1 tablet by mouth daily as needed for mild constipation.  . sertraline (ZOLOFT) 25 MG tablet Take 25 mg by mouth daily.  . simvastatin (ZOCOR) 10 MG tablet Take 10 mg by mouth at bedtime.  . simvastatin (ZOCOR)  40 MG tablet TAKE 1 TABLET BY MOUTH ONCE DAILY IN THE EVENING FOR 90 DAYS  . tiZANidine (ZANAFLEX) 4 MG tablet TAKE 1 TABLET (4 MG TOTAL) BY MOUTH EVERY 8 (EIGHT) HOURS AS NEEDED FOR MUSCLE SPASMS. (Patient not taking: Reported on 04/03/2017)  . UNABLE TO FIND C Pap   No current facility-administered medications on file prior to visit.      No Known Allergies   Objective:  Vascular Examination: Capillary refill time immediate x 10 digits.  Dorsalis pedis present b/l.  Posterior tibial pulses present b/l.  Sparse digital hair b/l.   Skin temperature gradient WNL b/l.   Dermatological Examination: Skin with normal turgor, texture and tone b/l.  Porokeratotic lesion right plantar heel with tenderness to palpation. No erythema, no edema, no drainage, no flocculence.  Toenails 1-5 b/l discolored, thick, dystrophic with subungual debris and pain with palpation to nailbeds due to thickness of nails.  Musculoskeletal: Muscle strength 5/5 to all LE muscle groups b/l.  No gross bony deformities b/l.  No pain, crepitus or joint limitation noted with ROM.   Neurological Examination: Protective sensation intact 5/5 with 10 gram monofilament bilaterally.  Assessment: Painful onychomycosis toenails 1-5 b/l  Porokeratosis plantar right heel NIDDM  Plan: 1. Toenails 1-5 b/l were debrided in length and girth without iatrogenic bleeding.  2. Porokeratosis right heel pared and enucleated with sterile scalpel blade without incident. 3. Patient to continue soft, supportive  shoe gear. 4. Patient to report any pedal injuries to medical professional immediately. 5. Follow up 3 months.  6. Patient/POA to call should there be a concern in the interim.

## 2019-02-13 ENCOUNTER — Ambulatory Visit: Payer: Medicare HMO | Admitting: Podiatry

## 2019-02-13 ENCOUNTER — Encounter: Payer: Self-pay | Admitting: Podiatry

## 2019-02-13 ENCOUNTER — Other Ambulatory Visit: Payer: Self-pay

## 2019-02-13 DIAGNOSIS — L84 Corns and callosities: Secondary | ICD-10-CM | POA: Diagnosis not present

## 2019-02-13 DIAGNOSIS — M79674 Pain in right toe(s): Secondary | ICD-10-CM | POA: Diagnosis not present

## 2019-02-13 DIAGNOSIS — B351 Tinea unguium: Secondary | ICD-10-CM

## 2019-02-13 DIAGNOSIS — E119 Type 2 diabetes mellitus without complications: Secondary | ICD-10-CM

## 2019-02-13 DIAGNOSIS — M79675 Pain in left toe(s): Secondary | ICD-10-CM

## 2019-02-13 NOTE — Patient Instructions (Signed)
Diabetes Mellitus and Foot Care Foot care is an important part of your health, especially when you have diabetes. Diabetes may cause you to have problems because of poor blood flow (circulation) to your feet and legs, which can cause your skin to:  Become thinner and drier.  Break more easily.  Heal more slowly.  Peel and crack. You may also have nerve damage (neuropathy) in your legs and feet, causing decreased feeling in them. This means that you may not notice minor injuries to your feet that could lead to more serious problems. Noticing and addressing any potential problems early is the best way to prevent future foot problems. How to care for your feet Foot hygiene  Wash your feet daily with warm water and mild soap. Do not use hot water. Then, pat your feet and the areas between your toes until they are completely dry. Do not soak your feet as this can dry your skin.  Trim your toenails straight across. Do not dig under them or around the cuticle. File the edges of your nails with an emery board or nail file.  Apply a moisturizing lotion or petroleum jelly to the skin on your feet and to dry, brittle toenails. Use lotion that does not contain alcohol and is unscented. Do not apply lotion between your toes. Shoes and socks  Wear clean socks or stockings every day. Make sure they are not too tight. Do not wear knee-high stockings since they may decrease blood flow to your legs.  Wear shoes that fit properly and have enough cushioning. Always look in your shoes before you put them on to be sure there are no objects inside.  To break in new shoes, wear them for just a few hours a day. This prevents injuries on your feet. Wounds, scrapes, corns, and calluses  Check your feet daily for blisters, cuts, bruises, sores, and redness. If you cannot see the bottom of your feet, use a mirror or ask someone for help.  Do not cut corns or calluses or try to remove them with medicine.  If you  find a minor scrape, cut, or break in the skin on your feet, keep it and the skin around it clean and dry. You may clean these areas with mild soap and water. Do not clean the area with peroxide, alcohol, or iodine.  If you have a wound, scrape, corn, or callus on your foot, look at it several times a day to make sure it is healing and not infected. Check for: ? Redness, swelling, or pain. ? Fluid or blood. ? Warmth. ? Pus or a bad smell. General instructions  Do not cross your legs. This may decrease blood flow to your feet.  Do not use heating pads or hot water bottles on your feet. They may burn your skin. If you have lost feeling in your feet or legs, you may not know this is happening until it is too late.  Protect your feet from hot and cold by wearing shoes, such as at the beach or on hot pavement.  Schedule a complete foot exam at least once a year (annually) or more often if you have foot problems. If you have foot problems, report any cuts, sores, or bruises to your health care provider immediately. Contact a health care provider if:  You have a medical condition that increases your risk of infection and you have any cuts, sores, or bruises on your feet.  You have an injury that is not   healing.  You have redness on your legs or feet.  You feel burning or tingling in your legs or feet.  You have pain or cramps in your legs and feet.  Your legs or feet are numb.  Your feet always feel cold.  You have pain around a toenail. Get help right away if:  You have a wound, scrape, corn, or callus on your foot and: ? You have pain, swelling, or redness that gets worse. ? You have fluid or blood coming from the wound, scrape, corn, or callus. ? Your wound, scrape, corn, or callus feels warm to the touch. ? You have pus or a bad smell coming from the wound, scrape, corn, or callus. ? You have a fever. ? You have a red line going up your leg. Summary  Check your feet every day  for cuts, sores, red spots, swelling, and blisters.  Moisturize feet and legs daily.  Wear shoes that fit properly and have enough cushioning.  If you have foot problems, report any cuts, sores, or bruises to your health care provider immediately.  Schedule a complete foot exam at least once a year (annually) or more often if you have foot problems. This information is not intended to replace advice given to you by your health care provider. Make sure you discuss any questions you have with your health care provider. Document Revised: 09/10/2018 Document Reviewed: 01/20/2016 Elsevier Patient Education  2020 [employer]  

## 2019-02-15 NOTE — Progress Notes (Signed)
Subjective: Eric Wood presents today for follow up of preventative diabetic foot care and callus(es) b/l feet and painful mycotic toenails b/l that are difficult to trim. Pain interferes with ambulation. Aggravating factors include wearing enclosed shoe gear. Pain is relieved with periodic professional debridement..   No Known Allergies   Objective: There were no vitals filed for this visit.  Vascular Examination:  Capillary refill time to digits immediate b/l, palpable DP pulses b/l, palpable PT pulses b/l, pedal hair sparse b/l and skin temperature gradient within normal limits b/l  Dermatological Examination: Pedal skin with normal turgor, texture and tone bilaterally, no open wounds bilaterally, no interdigital macerations bilaterally, toenails 1-5 b/l elongated, dystrophic, thickened, crumbly with subungual debris, hyperkeratotic lesion(s) b/l hallux.  No erythema, no edema, no drainage, no flocculence and porokeratotic lesion(s) right heel. No erythema, no edema, no drainage, no flocculence  Musculoskeletal: Normal muscle strength 5/5 to all lower extremity muscle groups bilaterally, no gross bony deformities bilaterally and no pain crepitus or joint limitation noted with ROM b/l  Neurological: Protective sensation intact 5/5 intact bilaterally with 10g monofilament b/l  Assessment: 1. Pain due to onychomycosis of toenails of both feet   2. Callus   3. Controlled type 2 diabetes mellitus without complication, without long-term current use of insulin ([place])   4. Encounter for diabetic foot exam ([place])    Plan: -Continue diabetic foot care principles. Literature dispensed on today.  -Toenails 1-5 b/l were debrided in length and girth without iatrogenic bleeding. -calluses were debrided without complication or incident. Total number debrided =2 b/l hallux -Painful porokeratotic lesions right heel pared and enucleated with sterile scalpel blade -Patient to continue soft, supportive  shoe gear daily. -Patient to report any pedal injuries to medical professional immediately. -Patient/POA to call should there be question/concern in the interim.  Return in about 3 months (around 05/13/2019) for diabetic nail trim.

## 2019-05-15 ENCOUNTER — Encounter: Payer: Self-pay | Admitting: Podiatry

## 2019-05-15 ENCOUNTER — Other Ambulatory Visit: Payer: Self-pay

## 2019-05-15 ENCOUNTER — Ambulatory Visit: Payer: Medicare HMO | Admitting: Podiatry

## 2019-05-15 DIAGNOSIS — L84 Corns and callosities: Secondary | ICD-10-CM

## 2019-05-15 DIAGNOSIS — M79674 Pain in right toe(s): Secondary | ICD-10-CM | POA: Diagnosis not present

## 2019-05-15 DIAGNOSIS — M79675 Pain in left toe(s): Secondary | ICD-10-CM | POA: Diagnosis not present

## 2019-05-15 DIAGNOSIS — B351 Tinea unguium: Secondary | ICD-10-CM | POA: Diagnosis not present

## 2019-05-15 DIAGNOSIS — E119 Type 2 diabetes mellitus without complications: Secondary | ICD-10-CM

## 2019-05-15 NOTE — Patient Instructions (Signed)
Diabetes Mellitus and Foot Care Foot care is an important part of your health, especially when you have diabetes. Diabetes may cause you to have problems because of poor blood flow (circulation) to your feet and legs, which can cause your skin to:  Become thinner and drier.  Break more easily.  Heal more slowly.  Peel and crack. You may also have nerve damage (neuropathy) in your legs and feet, causing decreased feeling in them. This means that you may not notice minor injuries to your feet that could lead to more serious problems. Noticing and addressing any potential problems early is the best way to prevent future foot problems. How to care for your feet Foot hygiene  Wash your feet daily with warm water and mild soap. Do not use hot water. Then, pat your feet and the areas between your toes until they are completely dry. Do not soak your feet as this can dry your skin.  Trim your toenails straight across. Do not dig under them or around the cuticle. File the edges of your nails with an emery board or nail file.  Apply a moisturizing lotion or petroleum jelly to the skin on your feet and to dry, brittle toenails. Use lotion that does not contain alcohol and is unscented. Do not apply lotion between your toes. Shoes and socks  Wear clean socks or stockings every day. Make sure they are not too tight. Do not wear knee-high stockings since they may decrease blood flow to your legs.  Wear shoes that fit properly and have enough cushioning. Always look in your shoes before you put them on to be sure there are no objects inside.  To break in new shoes, wear them for just a few hours a day. This prevents injuries on your feet. Wounds, scrapes, corns, and calluses  Check your feet daily for blisters, cuts, bruises, sores, and redness. If you cannot see the bottom of your feet, use a mirror or ask someone for help.  Do not cut corns or calluses or try to remove them with medicine.  If you  find a minor scrape, cut, or break in the skin on your feet, keep it and the skin around it clean and dry. You may clean these areas with mild soap and water. Do not clean the area with peroxide, alcohol, or iodine.  If you have a wound, scrape, corn, or callus on your foot, look at it several times a day to make sure it is healing and not infected. Check for: ? Redness, swelling, or pain. ? Fluid or blood. ? Warmth. ? Pus or a bad smell. General instructions  Do not cross your legs. This may decrease blood flow to your feet.  Do not use heating pads or hot water bottles on your feet. They may burn your skin. If you have lost feeling in your feet or legs, you may not know this is happening until it is too late.  Protect your feet from hot and cold by wearing shoes, such as at the beach or on hot pavement.  Schedule a complete foot exam at least once a year (annually) or more often if you have foot problems. If you have foot problems, report any cuts, sores, or bruises to your health care provider immediately. Contact a health care provider if:  You have a medical condition that increases your risk of infection and you have any cuts, sores, or bruises on your feet.  You have an injury that is not   healing.  You have redness on your legs or feet.  You feel burning or tingling in your legs or feet.  You have pain or cramps in your legs and feet.  Your legs or feet are numb.  Your feet always feel cold.  You have pain around a toenail. Get help right away if:  You have a wound, scrape, corn, or callus on your foot and: ? You have pain, swelling, or redness that gets worse. ? You have fluid or blood coming from the wound, scrape, corn, or callus. ? Your wound, scrape, corn, or callus feels warm to the touch. ? You have pus or a bad smell coming from the wound, scrape, corn, or callus. ? You have a fever. ? You have a red line going up your leg. Summary  Check your feet every day  for cuts, sores, red spots, swelling, and blisters.  Moisturize feet and legs daily.  Wear shoes that fit properly and have enough cushioning.  If you have foot problems, report any cuts, sores, or bruises to your health care provider immediately.  Schedule a complete foot exam at least once a year (annually) or more often if you have foot problems. This information is not intended to replace advice given to you by your health care provider. Make sure you discuss any questions you have with your health care provider. Document Revised: 09/10/2018 Document Reviewed: 01/20/2016 Elsevier Patient Education  2020 Else[employer] Inc.  Corns and Calluses Corns are small areas of thickened skin that occur on the top, sides, or tip of a toe. They contain a cone-shaped core with a point that can press on a nerve below. This causes pain.  Calluses are areas of thickened skin that can occur anywhere on the body, including the hands, fingers, palms, soles of the feet, and heels. Calluses are usually larger than corns. What are the causes? Corns and calluses are caused by rubbing (friction) or pressure, such as from shoes that are too tight or do not fit properly. What increases the risk? Corns are more likely to develop in people who have misshapen toes (toe deformities), such as hammer toes. Calluses can occur with friction to any area of the skin. They are more likely to develop in people who:  Work with their hands.  Wear shoes that fit poorly, are too tight, or are high-heeled.  Have toe deformities. What are the signs or symptoms? Symptoms of a corn or callus include:  A hard growth on the skin.  Pain or tenderness under the skin.  Redness and swelling.  Increased discomfort while wearing tight-fitting shoes, if your feet are affected. If a corn or callus becomes infected, symptoms may include:  Redness and swelling that gets worse.  Pain.  Fluid, blood, or pus draining from the corn or  callus. How is this diagnosed? Corns and calluses may be diagnosed based on your symptoms, your medical history, and a physical exam. How is this treated? Treatment for corns and calluses may include:  Removing the cause of the friction or pressure. This may involve: ? Changing your shoes. ? Wearing shoe inserts (orthotics) or other protective layers in your shoes, such as a corn pad. ? Wearing gloves.  Applying medicine to the skin (topical medicine) to help soften skin in the hardened, thickened areas.  Removing layers of dead skin with a file to reduce the size of the corn or callus.  Removing the corn or callus with a scalpel or laser.  Taking   antibiotic medicines, if your corn or callus is infected.  Having surgery, if a toe deformity is the cause. Follow these instructions at home:   Take over-the-counter and prescription medicines only as told by your health care provider.  If you were prescribed an antibiotic, take it as told by your health care provider. Do not stop taking it even if your condition starts to improve.  Wear shoes that fit well. Avoid wearing high-heeled shoes and shoes that are too tight or too loose.  Wear any padding, protective layers, gloves, or orthotics as told by your health care provider.  Soak your hands or feet and then use a file or pumice stone to soften your corn or callus. Do this as told by your health care provider.  Check your corn or callus every day for symptoms of infection. Contact a health care provider if you:  Notice that your symptoms do not improve with treatment.  Have redness or swelling that gets worse.  Notice that your corn or callus becomes painful.  Have fluid, blood, or pus coming from your corn or callus.  Have new symptoms. Summary  Corns are small areas of thickened skin that occur on the top, sides, or tip of a toe.  Calluses are areas of thickened skin that can occur anywhere on the body, including the  hands, fingers, palms, and soles of the feet. Calluses are usually larger than corns.  Corns and calluses are caused by rubbing (friction) or pressure, such as from shoes that are too tight or do not fit properly.  Treatment may include wearing any padding, protective layers, gloves, or orthotics as told by your health care provider. This information is not intended to replace advice given to you by your health care provider. Make sure you discuss any questions you have with your health care provider. Document Revised: 04/09/2018 Document Reviewed: 10/31/2016 Elsevier Patient Education  2020 Elsevier Inc.  

## 2019-05-23 NOTE — Progress Notes (Signed)
Subjective: Eric Wood presents today preventative diabetic foot care and callus(es) b/l feet and painful mycotic toenails b/l that are difficult to trim. Pain interferes with ambulation. Aggravating factors include wearing enclosed shoe gear. Pain is relieved with periodic professional debridement.   He voices no new pedal concerns on today's visit.  Eric Side., FNP is patient's PCP.   Past Medical History:  Diagnosis Date  . Arthritis   . Back pain    acupuncture  . Diabetes mellitus    type 2  . Family history of adverse reaction to anesthesia    Severe PONV  . Hypercholesteremia    under control  . Hypertension   . Sleep apnea    sometimes wear CPAP      Current Outpatient Medications on File Prior to Visit  Medication Sig Dispense Refill  . acetaminophen (TYLENOL) 325 MG tablet Take 325 mg by mouth every 6 (six) hours as needed for moderate pain.    Marland Kitchen aspirin EC 325 MG EC tablet Take 1 tablet (325 mg total) by mouth 2 (two) times daily after a meal. (Patient not taking: Reported on 04/03/2017) 30 tablet 0  . atenolol (TENORMIN) 25 MG tablet Take 25 mg by mouth every morning.     . clotrimazole (LOTRIMIN) 1 % cream Apply to both feet and between toes twice daily for 4 weeks. 30 g 1  . gabapentin (NEURONTIN) 300 MG capsule Take 300 mg by mouth 2 (two) times daily.    Marland Kitchen glipiZIDE (GLUCOTROL) 10 MG tablet Take 10 mg by mouth 2 (two) times daily before a meal.    . hydrochlorothiazide (HYDRODIURIL) 25 MG tablet TAKE 1 TABLET BY MOUTH ONCE DAILY FOR HIGH BLOOD PRESSURE FOR 90 DAYS    . insulin glargine (LANTUS) 100 UNIT/ML injection Inject into the skin.    Marland Kitchen lisinopril (PRINIVIL,ZESTRIL) 10 MG tablet Take 20 mg by mouth every morning.     Marland Kitchen lisinopril (ZESTRIL) 40 MG tablet TAKE 1 TABLET BY MOUTH ONCE DAILY FOR HIGH BLOOD PRESSURE FOR 90 DAYS    . meloxicam (MOBIC) 15 MG tablet Take 1 tablet (15 mg total) by mouth daily. (Patient not taking: Reported on 04/03/2017) 30 tablet 0   . metFORMIN (GLUCOPHAGE) 500 MG tablet Take 500 mg by mouth 2 (two) times daily with a meal.    . PRESCRIPTION MEDICATION Apply 1 application topically 2 (two) times daily as needed (dry skin). Lotion he receives from the [place]    . senna (SENOKOT) 8.6 MG TABS tablet Take 1 tablet by mouth daily as needed for mild constipation.    . sertraline (ZOLOFT) 25 MG tablet Take 25 mg by mouth daily.    . simvastatin (ZOCOR) 10 MG tablet Take 10 mg by mouth at bedtime.    . simvastatin (ZOCOR) 40 MG tablet TAKE 1 TABLET BY MOUTH ONCE DAILY IN THE EVENING FOR 90 DAYS    . tiZANidine (ZANAFLEX) 4 MG tablet TAKE 1 TABLET (4 MG TOTAL) BY MOUTH EVERY 8 (EIGHT) HOURS AS NEEDED FOR MUSCLE SPASMS. (Patient not taking: Reported on 04/03/2017) 60 tablet 0  . UNABLE TO FIND C Pap     No current facility-administered medications on file prior to visit.     No Known Allergies  Objective: Eric Wood is a pleasant 64 y.o. y.o. Patient Race: Black or African American [2]  male in NAD. AAO x 3.  There were no vitals filed for this visit.  Vascular Examination: Neurovascular status unchanged b/l. Capillary  refill time to digits immediate b/l. Palpable DP pulses b/l. Palpable PT pulses b/l. Pedal hair sparse b/l. Skin temperature gradient within normal limits b/l.  Dermatological Examination: Pedal skin with normal turgor, texture and tone bilaterally. No open wounds bilaterally. No interdigital macerations bilaterally. Toenails 1-5 b/l elongated, dystrophic, thickened, crumbly with subungual debris and tenderness to dorsal palpation. Hyperkeratotic lesion(s) L hallux and R hallux.  No erythema, no edema, no drainage, no flocculence. Porokeratotic lesion(s) plantar aspect right heel. No erythema, no edema, no drainage, no flocculence.  Musculoskeletal: Normal muscle strength 5/5 to all lower extremity muscle groups bilaterally. No gross bony deformities bilaterally. No pain crepitus or joint limitation noted with ROM  b/l. Patient ambulates independent of any assistive aids.  Neurological Examination: Protective sensation intact 5/5 intact bilaterally with 10g monofilament b/l. Vibratory sensation intact b/l. Proprioception intact bilaterally.  Assessment: 1. Pain due to onychomycosis of toenails of both feet   2. Callus   3. Controlled type 2 diabetes mellitus without complication, without long-term current use of insulin ([place])   Plan: -Examined patient. -No new findings. No new orders. -Continue diabetic foot care principles. Literature dispensed on today.  -Toenails 1-5 b/l were debrided in length and girth with sterile nail nippers and dremel without iatrogenic bleeding.  -Callus(es) L hallux and R hallux pared utilizing sterile scalpel blade without complication or incident. Total number debrided =2. -Painful porokeratotic lesion(s) plantar aspect right heel pared and enucleated with sterile scalpel blade without incident. -Patient to continue soft, supportive shoe gear daily. -Patient to report any pedal injuries to medical professional immediately. -Patient/POA to call should there be question/concern in the interim.  Return in about 3 months (around 08/15/2019) for diabetic nail and callus trim.  Marzetta Board, DPM

## 2019-08-07 ENCOUNTER — Other Ambulatory Visit: Payer: Self-pay

## 2019-08-07 ENCOUNTER — Ambulatory Visit: Payer: Medicare HMO | Admitting: Podiatry

## 2019-08-07 ENCOUNTER — Encounter: Payer: Self-pay | Admitting: Podiatry

## 2019-08-07 DIAGNOSIS — L84 Corns and callosities: Secondary | ICD-10-CM | POA: Diagnosis not present

## 2019-08-07 DIAGNOSIS — E119 Type 2 diabetes mellitus without complications: Secondary | ICD-10-CM

## 2019-08-07 DIAGNOSIS — M79674 Pain in right toe(s): Secondary | ICD-10-CM | POA: Diagnosis not present

## 2019-08-07 DIAGNOSIS — M79675 Pain in left toe(s): Secondary | ICD-10-CM

## 2019-08-07 DIAGNOSIS — B351 Tinea unguium: Secondary | ICD-10-CM | POA: Diagnosis not present

## 2019-08-07 NOTE — Progress Notes (Signed)
Subjective: Eric Wood presents today preventative diabetic foot care and callus(es) b/l feet and painful mycotic toenails b/l that are difficult to trim. Pain interferes with ambulation. Aggravating factors include wearing enclosed shoe gear. Pain is relieved with periodic professional debridement.   He voices no new pedal concerns on today's visit.  Eric Wood., FNP is patient's PCP.   Past Medical History:  Diagnosis Date  . Arthritis   . Back pain    acupuncture  . Diabetes mellitus    type 2  . Family history of adverse reaction to anesthesia    Severe PONV  . Hypercholesteremia    under control  . Hypertension   . Sleep apnea    sometimes wear CPAP      Current Outpatient Medications on File Prior to Visit  Medication Sig Dispense Refill  . acetaminophen (TYLENOL) 325 MG tablet Take 325 mg by mouth every 6 (six) hours as needed for moderate pain.    Marland Kitchen atenolol (TENORMIN) 25 MG tablet Take 25 mg by mouth every morning.     . clotrimazole (LOTRIMIN) 1 % cream Apply to both feet and between toes twice daily for 4 weeks. 30 g 1  . gabapentin (NEURONTIN) 300 MG capsule Take 300 mg by mouth 2 (two) times daily.    Marland Kitchen glipiZIDE (GLUCOTROL) 10 MG tablet Take 10 mg by mouth 2 (two) times daily before a meal.    . hydrochlorothiazide (HYDRODIURIL) 25 MG tablet TAKE 1 TABLET BY MOUTH ONCE DAILY FOR HIGH BLOOD PRESSURE FOR 90 DAYS    . insulin glargine (LANTUS) 100 UNIT/ML injection Inject into the skin.    Marland Kitchen lisinopril (PRINIVIL,ZESTRIL) 10 MG tablet Take 20 mg by mouth every morning.     Marland Kitchen lisinopril (ZESTRIL) 40 MG tablet TAKE 1 TABLET BY MOUTH ONCE DAILY FOR HIGH BLOOD PRESSURE FOR 90 DAYS    . metFORMIN (GLUCOPHAGE) 500 MG tablet Take 500 mg by mouth 2 (two) times daily with a meal.    . PRESCRIPTION MEDICATION Apply 1 application topically 2 (two) times daily as needed (dry skin). Lotion he receives from the [place]    . senna (SENOKOT) 8.6 MG TABS tablet Take 1 tablet by mouth  daily as needed for mild constipation.    . sertraline (ZOLOFT) 25 MG tablet Take 25 mg by mouth daily.    . simvastatin (ZOCOR) 10 MG tablet Take 10 mg by mouth at bedtime.    . simvastatin (ZOCOR) 40 MG tablet TAKE 1 TABLET BY MOUTH ONCE DAILY IN THE EVENING FOR 90 DAYS    . UNABLE TO FIND C Pap    . aspirin EC 325 MG EC tablet Take 1 tablet (325 mg total) by mouth 2 (two) times daily after a meal. (Patient not taking: Reported on 04/03/2017) 30 tablet 0  . meloxicam (MOBIC) 15 MG tablet Take 1 tablet (15 mg total) by mouth daily. (Patient not taking: Reported on 04/03/2017) 30 tablet 0  . tiZANidine (ZANAFLEX) 4 MG tablet TAKE 1 TABLET (4 MG TOTAL) BY MOUTH EVERY 8 (EIGHT) HOURS AS NEEDED FOR MUSCLE SPASMS. (Patient not taking: Reported on 04/03/2017) 60 tablet 0   No current facility-administered medications on file prior to visit.     No Known Allergies  Objective: Eric Wood is a pleasant 64 y.o. y.o. Patient Race: Black or African American [2]  male in NAD. AAO x 3.  There were no vitals filed for this visit.  Vascular Examination: Neurovascular status unchanged b/l. Capillary  refill time to digits immediate b/l. Palpable DP pulses b/l. Palpable PT pulses b/l. Pedal hair sparse b/l. Skin temperature gradient within normal limits b/l.  Dermatological Examination: Pedal skin with normal turgor, texture and tone bilaterally. No open wounds bilaterally. No interdigital macerations bilaterally. Toenails 1-5 b/l elongated, dystrophic, thickened, crumbly with subungual debris and tenderness to dorsal palpation. Hyperkeratotic lesion(s) L hallux and R hallux.  No erythema, no edema, no drainage, no flocculence. Porokeratotic lesion(s) plantar aspect right heel. No erythema, no edema, no drainage, no flocculence.  Musculoskeletal: Normal muscle strength 5/5 to all lower extremity muscle groups bilaterally. No gross bony deformities bilaterally. No pain crepitus or joint limitation noted with ROM  b/l. Patient ambulates independent of any assistive aids.  Neurological Examination: Protective sensation intact 5/5 intact bilaterally with 10g monofilament b/l. Vibratory sensation intact b/l. Proprioception intact bilaterally.  Assessment: 1. Pain due to onychomycosis of toenails of both feet   2. Callus   3. Controlled type 2 diabetes mellitus without complication, without long-term current use of insulin ([place])     Plan: -Examined patient. -No new findings. No new orders. -Continue diabetic foot care principles. Literature dispensed on today.  -Toenails 1-5 b/l were debrided in length and girth with sterile nail nippers and dremel without iatrogenic bleeding.  -Callus(es) L hallux and R hallux pared utilizing sterile scalpel blade without complication or incident. Total number debrided =2. -Painful porokeratotic lesion(s) plantar aspect right heel pared and enucleated with sterile scalpel blade without incident. -Patient to continue soft, supportive shoe gear daily. -Patient to report any pedal injuries to medical professional immediately. -Patient/POA to call should there be question/concern in the interim.  Return in about 3 months (around 11/07/2019) for diabetic nail trim.  Marzetta Board, DPM

## 2019-08-17 ENCOUNTER — Emergency Department (HOSPITAL_COMMUNITY): Payer: No Typology Code available for payment source

## 2019-08-17 ENCOUNTER — Encounter (HOSPITAL_COMMUNITY): Payer: Self-pay

## 2019-08-17 ENCOUNTER — Other Ambulatory Visit: Payer: Self-pay

## 2019-08-17 DIAGNOSIS — E119 Type 2 diabetes mellitus without complications: Secondary | ICD-10-CM | POA: Insufficient documentation

## 2019-08-17 DIAGNOSIS — U071 COVID-19: Secondary | ICD-10-CM | POA: Diagnosis not present

## 2019-08-17 DIAGNOSIS — I10 Essential (primary) hypertension: Secondary | ICD-10-CM | POA: Insufficient documentation

## 2019-08-17 DIAGNOSIS — Z96643 Presence of artificial hip joint, bilateral: Secondary | ICD-10-CM | POA: Insufficient documentation

## 2019-08-17 DIAGNOSIS — R05 Cough: Secondary | ICD-10-CM | POA: Diagnosis present

## 2019-08-17 DIAGNOSIS — Z7982 Long term (current) use of aspirin: Secondary | ICD-10-CM | POA: Diagnosis not present

## 2019-08-17 DIAGNOSIS — Z79899 Other long term (current) drug therapy: Secondary | ICD-10-CM | POA: Insufficient documentation

## 2019-08-17 LAB — COMPREHENSIVE METABOLIC PANEL
ALT: 26 U/L (ref 0–44)
AST: 37 U/L (ref 15–41)
Albumin: 3.9 g/dL (ref 3.5–5.0)
Alkaline Phosphatase: 75 U/L (ref 38–126)
Anion gap: 12 (ref 5–15)
BUN: 16 mg/dL (ref 8–23)
CO2: 22 mmol/L (ref 22–32)
Calcium: 8.8 mg/dL — ABNORMAL LOW (ref 8.9–10.3)
Chloride: 102 mmol/L (ref 98–111)
Creatinine, Ser: 1.86 mg/dL — ABNORMAL HIGH (ref 0.61–1.24)
GFR calc Af Amer: 43 mL/min — ABNORMAL LOW (ref >60)
GFR calc non Af Amer: 37 mL/min — ABNORMAL LOW (ref >60)
Glucose, Bld: 147 mg/dL — ABNORMAL HIGH (ref 70–99)
Potassium: 4 mmol/L (ref 3.5–5.1)
Sodium: 136 mmol/L (ref 135–145)
Total Bilirubin: 0.7 mg/dL (ref 0.3–1.2)
Total Protein: 8.2 g/dL — ABNORMAL HIGH (ref 6.5–8.1)

## 2019-08-17 LAB — CBC WITH DIFFERENTIAL/PLATELET
Abs Immature Granulocytes: 0.01 10*3/uL (ref 0.00–0.07)
Basophils Absolute: 0 10*3/uL (ref 0.0–0.1)
Basophils Relative: 0 %
Eosinophils Absolute: 0 10*3/uL (ref 0.0–0.5)
Eosinophils Relative: 0 %
HCT: 41.4 % (ref 39.0–52.0)
Hemoglobin: 13.8 g/dL (ref 13.0–17.0)
Immature Granulocytes: 0 %
Lymphocytes Relative: 21 %
Lymphs Abs: 0.9 10*3/uL (ref 0.7–4.0)
MCH: 30.9 pg (ref 26.0–34.0)
MCHC: 33.3 g/dL (ref 30.0–36.0)
MCV: 92.6 fL (ref 80.0–100.0)
Monocytes Absolute: 0.4 10*3/uL (ref 0.1–1.0)
Monocytes Relative: 8 %
Neutro Abs: 3.1 10*3/uL (ref 1.7–7.7)
Neutrophils Relative %: 71 %
Platelets: 263 10*3/uL (ref 150–400)
RBC: 4.47 MIL/uL (ref 4.22–5.81)
RDW: 13 % (ref 11.5–15.5)
WBC: 4.4 10*3/uL (ref 4.0–10.5)
nRBC: 0 % (ref 0.0–0.2)

## 2019-08-17 MED ORDER — ACETAMINOPHEN 325 MG PO TABS
650.0000 mg | ORAL_TABLET | Freq: Once | ORAL | Status: AC | PRN
  Administered 2019-08-17: 650 mg via ORAL
  Filled 2019-08-17: qty 2

## 2019-08-17 NOTE — ED Triage Notes (Signed)
Pt sts sternal chest pains, shob and fever x 3 days. States he has been vaccinated.

## 2019-08-17 NOTE — ED Notes (Signed)
Eric Wood on ice collected also

## 2019-08-18 ENCOUNTER — Emergency Department (HOSPITAL_COMMUNITY)
Admission: EM | Admit: 2019-08-18 | Discharge: 2019-08-18 | Disposition: A | Discharge status: Home / Self Care | Payer: No Typology Code available for payment source | Attending: Emergency Medicine | Admitting: Emergency Medicine

## 2019-08-18 ENCOUNTER — Other Ambulatory Visit: Payer: Self-pay | Admitting: Adult Health

## 2019-08-18 ENCOUNTER — Emergency Department (HOSPITAL_COMMUNITY)
Admit: 2019-08-18 | Discharge: 2019-08-18 | Disposition: A | Discharge status: Home / Self Care | Payer: No Typology Code available for payment source | Attending: Pulmonary Disease | Admitting: Pulmonary Disease

## 2019-08-18 DIAGNOSIS — U071 COVID-19: Secondary | ICD-10-CM

## 2019-08-18 LAB — SARS CORONAVIRUS 2 BY RT PCR (HOSPITAL ORDER, PERFORMED IN ~~LOC~~ HOSPITAL LAB): SARS Coronavirus 2: POSITIVE — AB

## 2019-08-18 LAB — TROPONIN I (HIGH SENSITIVITY)
Troponin I (High Sensitivity): 27 ng/L — ABNORMAL HIGH (ref <18)
Troponin I (High Sensitivity): 24 ng/L — ABNORMAL HIGH (ref <18)

## 2019-08-18 MED ORDER — FAMOTIDINE IN NACL 20-0.9 MG/50ML-% IV SOLN: 20.0000 mg | Freq: Once | INTRAVENOUS | Status: DC | PRN

## 2019-08-18 MED ORDER — ALBUTEROL SULFATE HFA 108 (90 BASE) MCG/ACT IN AERS: 2.0000 | INHALATION_SPRAY | Freq: Once | RESPIRATORY_TRACT | Status: DC | PRN

## 2019-08-18 MED ORDER — SODIUM CHLORIDE 0.9 % IV SOLN
1200.0000 mg | Freq: Once | INTRAVENOUS | Status: AC
  Administered 2019-08-18: 1200 mg via INTRAVENOUS
  Filled 2019-08-18: qty 10

## 2019-08-18 MED ORDER — DIPHENHYDRAMINE HCL 50 MG/ML IJ SOLN: 50.0000 mg | Freq: Once | INTRAMUSCULAR | Status: DC | PRN

## 2019-08-18 MED ORDER — EPINEPHRINE 0.3 MG/0.3ML IJ SOAJ: 0.3000 mg | Freq: Once | INTRAMUSCULAR | Status: DC | PRN

## 2019-08-18 MED ORDER — SODIUM CHLORIDE 0.9 % IV SOLN: INTRAVENOUS | Status: DC | PRN

## 2019-08-18 MED ORDER — METHYLPREDNISOLONE SODIUM SUCC 125 MG IJ SOLR: 125.0000 mg | Freq: Once | INTRAMUSCULAR | Status: DC | PRN

## 2019-08-18 NOTE — Progress Notes (Signed)
I connected by phone with Eric Wood on 08/18/2019 at 8:20 AM to discuss the potential use of a new treatment for mild to moderate COVID-19 viral infection in non-hospitalized patients.  This patient is a 64 y.o. male that meets the FDA criteria for Emergency Use Authorization of COVID monoclonal antibody casirivimab/imdevimab.  Has a (+) direct SARS-CoV-2 viral test result  Has mild or moderate COVID-19   Is NOT hospitalized due to COVID-19  Is within 10 days of symptom onset  Has at least one of the high risk factor(s) for progression to severe COVID-19 and/or hospitalization as defined in EUA.  Specific high risk criteria : Older age (>/= 66 yo)   I have spoken and communicated the following to the patient or parent/caregiver regarding COVID monoclonal antibody treatment:  1. FDA has authorized the emergency use for the treatment of mild to moderate COVID-19 in adults and pediatric patients with positive results of direct SARS-CoV-2 viral testing who are 34 years of age and older weighing at least 40 kg, and who are at high risk for progressing to severe COVID-19 and/or hospitalization.  2. The significant known and potential risks and benefits of COVID monoclonal antibody, and the extent to which such potential risks and benefits are unknown.  3. Information on available alternative treatments and the risks and benefits of those alternatives, including clinical trials.  4. Patients treated with COVID monoclonal antibody should continue to self-isolate and use infection control measures (e.g., wear mask, isolate, social distance, avoid sharing personal items, clean and disinfect "high touch" surfaces, and frequent handwashing) according to CDC guidelines.   5. The patient or parent/caregiver has the option to accept or refuse COVID monoclonal antibody treatment.  After reviewing this information with the patient, The patient agreed to proceed with receiving casirivimab\imdevimab  infusion and will be provided a copy of the Fact sheet prior to receiving the infusion. Scot Dock 08/18/2019 8:20 AM

## 2019-08-18 NOTE — Progress Notes (Signed)
Patient arrived to infusion clinic for Regen-Cov. Infusion completed. VSS. AVS reviewed. D/c'ed to home.

## 2019-08-18 NOTE — ED Notes (Signed)
Date and time results received: 08/18/19 0204 (use smartphrase ".now" to insert current time)  Test: covid Critical Value: positive  Name of Provider Notified: Severiano Gilbert, MD  Orders Received? Or Actions Taken?: no new orders

## 2019-08-18 NOTE — Discharge Instructions (Signed)

## 2019-08-23 NOTE — ED Provider Notes (Signed)
[facility] DEPT Provider Note   CSN: [id number] Arrival date & time: 08/17/19  2119     History Chief Complaint  Patient presents with  . Chest Pain    Eric Wood is a 64 y.o. male.  HPI   64yM with cough, fever and chest tightness. Onset 3d ago. Persistent since. Body aches. Subjective fever. No v/d. No urinary complaints. Received covid vacccinations.   Past Medical History:  Diagnosis Date  . Arthritis   . Back pain    acupuncture  . Diabetes mellitus    type 2  . Family history of adverse reaction to anesthesia    Severe PONV  . Hypercholesteremia    under control  . Hypertension   . Sleep apnea    sometimes wear CPAP    Patient Active Problem List   Diagnosis Date Noted  . Mood disorder ([place]) 09/08/2014  . Osteoarthritis of left hip 08/20/2014  . 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 Osteoarthritis of right hip 06/11/2014  . Status post total replacement of right hip 06/11/2014   Past Surgical History:  Procedure Laterality Date  . acupuncture     For back and hip  . COLONOSCOPY W/ POLYPECTOMY    . NO PAST SURGERIES    . TOTAL HIP ARTHROPLASTY Right 06/11/2014   Procedure: RIGHT TOTAL HIP ARTHROPLASTY ANTERIOR APPROACH;  Surgeon: Mcarthur Rossetti, MD;  Location: [facility];  Service: Orthopedics;  Laterality: Right;  . TOTAL HIP ARTHROPLASTY Left 08/20/2014   Procedure: LEFT TOTAL HIP ARTHROPLASTY ANTERIOR APPROACH;  Surgeon: Mcarthur Rossetti, MD;  Location: [facility];  Service: Orthopedics;  Laterality: Left;     No family history on file.  Social History   Tobacco Use  . Smoking status: Never Smoker  . Smokeless tobacco: Never Used  Substance Use Topics  . Alcohol use: Yes    Comment: occ beer   . Drug use: No    Types: "Crack" cocaine    Comment: last time 1-2 years ago   Home Medications Prior to Admission medications   Medication Sig Start Date End Date Taking? Authorizing Provider   acetaminophen (TYLENOL) 325 MG tablet Take 325 mg by mouth every 6 (six) hours as needed for moderate pain.    [provider]  aspirin EC 325 MG EC tablet Take 1 tablet (325 mg total) by mouth 2 (two) times daily after a meal. Patient not taking: Reported on 04/03/2017 06/12/14   Mcarthur Rossetti, MD  atenolol (TENORMIN) 25 MG tablet Take 25 mg by mouth every morning.     [provider]  clotrimazole (LOTRIMIN) 1 % cream Apply to both feet and between toes twice daily for 4 weeks. 08/11/18   Marzetta Board, DPM  gabapentin (NEURONTIN) 300 MG capsule Take 300 mg by mouth 2 (two) times daily.    [provider]  glipiZIDE (GLUCOTROL) 10 MG tablet Take 10 mg by mouth 2 (two) times daily before a meal.    [provider]  hydrochlorothiazide (HYDRODIURIL) 25 MG tablet TAKE 1 TABLET BY MOUTH ONCE DAILY FOR HIGH BLOOD PRESSURE FOR 90 DAYS 06/30/18   [provider]  insulin glargine (LANTUS) 100 UNIT/ML injection Inject into the skin.    [provider]  lisinopril (PRINIVIL,ZESTRIL) 10 MG tablet Take 20 mg by mouth every morning.     [provider]  lisinopril (ZESTRIL) 40 MG tablet TAKE 1 TABLET BY MOUTH ONCE DAILY FOR HIGH BLOOD  PRESSURE FOR 90 DAYS 06/30/18   [provider]  meloxicam (MOBIC) 15 MG tablet Take 1 tablet (15 mg total) by mouth daily. Patient not taking: Reported on 04/03/2017 07/26/16   Mcarthur Rossetti, MD  metFORMIN (GLUCOPHAGE) 500 MG tablet Take 500 mg by mouth 2 (two) times daily with a meal.    [provider]  PRESCRIPTION MEDICATION Apply 1 application topically 2 (two) times daily as needed (dry skin). Lotion he receives from the [employer], Historical, MD  senna (SENOKOT) 8.6 MG TABS tablet Take 1 tablet by mouth daily as needed for mild constipation.    [provider]  sertraline (ZOLOFT) 25 MG tablet Take 25 mg by mouth daily.    [provider]   simvastatin (ZOCOR) 10 MG tablet Take 10 mg by mouth at bedtime.    [provider]  simvastatin (ZOCOR) 40 MG tablet TAKE 1 TABLET BY MOUTH ONCE DAILY IN THE EVENING FOR 90 DAYS 06/30/18   [provider]  tiZANidine (ZANAFLEX) 4 MG tablet TAKE 1 TABLET (4 MG TOTAL) BY MOUTH EVERY 8 (EIGHT) HOURS AS NEEDED FOR MUSCLE SPASMS. Patient not taking: Reported on 04/03/2017 08/17/16   Mcarthur Rossetti, MD  UNABLE TO FIND C Pap    [provider]    Allergies    Patient has no known allergies.  Review of Systems   Review of Systems All systems reviewed and negative, other than as noted in HPI.  Physical Exam Updated Vital Signs BP (!) 146/67   Pulse 76   Temp 98.8 F (37.1 C) (Oral)   Resp (!) 25   Ht 5' 9.5" (1.765 m)   Wt 108 kg   SpO2 96%   BMI 34.64 kg/m   Physical Exam Vitals and nursing note reviewed.  Constitutional:      General: He is not in acute distress.    Appearance: He is well-developed.  HENT:     Head: Normocephalic and atraumatic.  Eyes:     General:        Right eye: No discharge.        Left eye: No discharge.     Conjunctiva/sclera: Conjunctivae normal.  Cardiovascular:     Rate and Rhythm: Regular rhythm. Tachycardia present.     Heart sounds: Normal heart sounds. No murmur heard.  No friction rub. No gallop.   Pulmonary:     Effort: Pulmonary effort is normal. No respiratory distress.     Breath sounds: Normal breath sounds.  Abdominal:     General: There is no distension.     Palpations: Abdomen is soft.     Tenderness: There is no abdominal tenderness.  Musculoskeletal:        General: No tenderness.     Cervical back: Neck supple.  Skin:    General: Skin is warm and dry.  Neurological:     Mental Status: He is alert.  Psychiatric:        Behavior: Behavior normal.        Thought Content: Thought content normal.     ED Results / Procedures / Treatments   Labs (all labs ordered are listed, but only  abnormal results are displayed) Labs Reviewed  SARS CORONAVIRUS 2 BY RT PCR (HOSPITAL ORDER, [facility]) - Abnormal; Notable for the following components:      Result Value   SARS Coronavirus 2 POSITIVE (*)    All other components within normal  limits  COMPREHENSIVE METABOLIC PANEL - Abnormal; Notable for the following components:   Glucose, Bld 147 (*)    Creatinine, Ser 1.86 (*)    Calcium 8.8 (*)    Total Protein 8.2 (*)    GFR calc non Af Amer 37 (*)    GFR calc Af Amer 43 (*)    All other components within normal limits  TROPONIN I (HIGH SENSITIVITY) - Abnormal; Notable for the following components:   Troponin I (High Sensitivity) 27 (*)    All other components within normal limits  TROPONIN I (HIGH SENSITIVITY) - Abnormal; Notable for the following components:   Troponin I (High Sensitivity) 24 (*)    All other components within normal limits  CBC WITH DIFFERENTIAL/PLATELET    EKG EKG Interpretation  Date/Time:  Monday August 17 2019 21:53:12 EDT Ventricular Rate:  120 PR Interval:    QRS Duration: 87 QT Interval:  312 QTC Calculation: 441 R Axis:   -27 Text Interpretation: Sinus tachycardia Atrial premature complexes Abnormal R-wave progression, late transition Consider left ventricular hypertrophy Confirmed by Virgel Manifold [phone]) on 08/18/2019 7:40:22 AM   Radiology No results found.   DG Chest 2 View  Result Date: 08/17/2019 CLINICAL DATA:  64 year old male with shortness of breath. EXAM: CHEST - 2 VIEW COMPARISON:  Chest radiograph dated 03/12/2010 FINDINGS: The shallow inspiration. There is mild diffuse interstitial prominence which may represent atelectatic changes. Atypical infection is less likely. Clinical correlation is recommended. No focal consolidation, pleural effusion, or pneumothorax. Stable cardiac silhouette. No acute osseous pathology. IMPRESSION: No focal consolidation. Electronically Signed   By: Anner Crete M.D.    On: 08/17/2019 22:43    Procedures Procedures (including critical care time)  Medications Ordered in ED Medications  acetaminophen (TYLENOL) tablet 650 mg (650 mg Oral Given 08/17/19 2227)    ED Course  I have reviewed the triage vital signs and the nursing notes.  Pertinent labs & imaging results that were available during my care of the patient were reviewed by me and considered in my medical decision making (see chart for details).    MDM Rules/Calculators/A&P                          [id number] with symptomatic from COVID. Chest tightness typical for ACS. Troponin minimally elevated. Repeat unchanged. Doubt PE, dissection or other emergent process.   Eric Wood was evaluated in Emergency Department on 08/23/2019 for the symptoms described in the history of present illness. He was evaluated in the context of the global COVID-19 pandemic, which necessitated consideration that the patient might be at risk for infection with the SARS-CoV-2 virus that causes COVID-19. Institutional protocols and algorithms that pertain to the evaluation of patients at risk for COVID-19 are in a state of rapid change based on information released by regulatory bodies including the CDC and federal and state organizations. These policies and algorithms were followed during the patient's care in the ED.   Final Clinical Impression(s) / ED Diagnoses Final diagnoses:  COVID-19 virus infection    Rx / DC Orders ED Discharge Orders    None       Virgel Manifold, MD 08/23/19 1512

## 2019-11-13 ENCOUNTER — Encounter: Payer: Self-pay | Admitting: Podiatry

## 2019-11-13 ENCOUNTER — Ambulatory Visit: Payer: Medicare HMO | Admitting: Podiatry

## 2019-11-13 ENCOUNTER — Other Ambulatory Visit: Payer: Self-pay

## 2019-11-13 DIAGNOSIS — M79675 Pain in left toe(s): Secondary | ICD-10-CM

## 2019-11-13 DIAGNOSIS — E119 Type 2 diabetes mellitus without complications: Secondary | ICD-10-CM

## 2019-11-13 DIAGNOSIS — Q828 Other specified congenital malformations of skin: Secondary | ICD-10-CM

## 2019-11-13 DIAGNOSIS — Z794 Long term (current) use of insulin: Secondary | ICD-10-CM | POA: Diagnosis not present

## 2019-11-13 DIAGNOSIS — M79674 Pain in right toe(s): Secondary | ICD-10-CM

## 2019-11-13 DIAGNOSIS — B351 Tinea unguium: Secondary | ICD-10-CM

## 2019-11-15 NOTE — Progress Notes (Signed)
Subjective: Eric Wood presents today preventative diabetic foot care and callus(es) b/l feet and painful mycotic toenails b/l that are difficult to trim. Pain interferes with ambulation. Aggravating factors include wearing enclosed shoe gear. Pain is relieved with periodic professional debridement.   He voices no new pedal concerns on today's visit.  Sonia Side., FNP is patient's PCP. He is also followed by the [facility] now. States the [employer] provides him with two pair of shoes/year.  Past Medical History:  Diagnosis Date  . Arthritis   . Back pain    acupuncture  . Diabetes mellitus    type 2  . Family history of adverse reaction to anesthesia    Severe PONV  . Hypercholesteremia    under control  . Hypertension   . Sleep apnea    sometimes wear CPAP      Current Outpatient Medications on File Prior to Visit  Medication Sig Dispense Refill  . acetaminophen (TYLENOL) 325 MG tablet Take 325 mg by mouth every 6 (six) hours as needed for moderate pain.    Marland Kitchen aspirin EC 325 MG EC tablet Take 1 tablet (325 mg total) by mouth 2 (two) times daily after a meal. (Patient not taking: Reported on 04/03/2017) 30 tablet 0  . atenolol (TENORMIN) 25 MG tablet Take 25 mg by mouth every morning.     . clotrimazole (LOTRIMIN) 1 % cream Apply to both feet and between toes twice daily for 4 weeks. 30 g 1  . gabapentin (NEURONTIN) 300 MG capsule Take 300 mg by mouth 2 (two) times daily.    Marland Kitchen glipiZIDE (GLUCOTROL) 10 MG tablet Take 10 mg by mouth 2 (two) times daily before a meal.    . hydrochlorothiazide (HYDRODIURIL) 25 MG tablet TAKE 1 TABLET BY MOUTH ONCE DAILY FOR HIGH BLOOD PRESSURE FOR 90 DAYS    . insulin glargine (LANTUS) 100 UNIT/ML injection Inject into the skin.    Marland Kitchen lisinopril (PRINIVIL,ZESTRIL) 10 MG tablet Take 20 mg by mouth every morning.     Marland Kitchen lisinopril (ZESTRIL) 40 MG tablet TAKE 1 TABLET BY MOUTH ONCE DAILY FOR HIGH BLOOD PRESSURE FOR 90 DAYS    .  meloxicam (MOBIC) 15 MG tablet Take 1 tablet (15 mg total) by mouth daily. (Patient not taking: Reported on 04/03/2017) 30 tablet 0  . metFORMIN (GLUCOPHAGE) 500 MG tablet Take 500 mg by mouth 2 (two) times daily with a meal.    . PRESCRIPTION MEDICATION Apply 1 application topically 2 (two) times daily as needed (dry skin). Lotion he receives from the [place]    . senna (SENOKOT) 8.6 MG TABS tablet Take 1 tablet by mouth daily as needed for mild constipation.    . sertraline (ZOLOFT) 25 MG tablet Take 25 mg by mouth daily.    . simvastatin (ZOCOR) 10 MG tablet Take 10 mg by mouth at bedtime.    . simvastatin (ZOCOR) 40 MG tablet TAKE 1 TABLET BY MOUTH ONCE DAILY IN THE EVENING FOR 90 DAYS    . tiZANidine (ZANAFLEX) 4 MG tablet TAKE 1 TABLET (4 MG TOTAL) BY MOUTH EVERY 8 (EIGHT) HOURS AS NEEDED FOR MUSCLE SPASMS. (Patient not taking: Reported on 04/03/2017) 60 tablet 0  . UNABLE TO FIND C Pap     No current facility-administered medications on file prior to visit.     No Known Allergies  Objective: Eric Wood is a pleasant 64 y.o. y.o. Patient Race: Black or African American [2]  male in NAD. AAO  x 3.  There were no vitals filed for this visit.  Vascular Examination: Neurovascular status unchanged b/l. Capillary refill time to digits immediate b/l. Palpable DP pulses b/l. Palpable PT pulses b/l. Pedal hair sparse b/l. Skin temperature gradient within normal limits b/l.  Dermatological Examination: Pedal skin with normal turgor, texture and tone bilaterally. No open wounds bilaterally. No interdigital macerations bilaterally. Toenails 1-5 b/l elongated, dystrophic, thickened, crumbly with subungual debris and tenderness to dorsal palpation. Hyperkeratotic lesion(s) L hallux and R hallux.  No erythema, no edema, no drainage, no flocculence. Porokeratotic lesion(s) plantar aspect right heel. No erythema, no edema, no drainage, no flocculence.  Musculoskeletal: Normal muscle strength 5/5 to all  lower extremity muscle groups bilaterally. No gross bony deformities bilaterally. No pain crepitus or joint limitation noted with ROM b/l. Patient ambulates independent of any assistive aids.  Neurological Examination: Protective sensation intact 5/5 intact bilaterally with 10g monofilament b/l. Vibratory sensation intact b/l. Proprioception intact bilaterally.  Assessment: 1. Pain due to onychomycosis of toenails of both feet   2. Porokeratosis   3. Type 2 diabetes mellitus without complication, with long-term current use of insulin ([place])     Plan: -Examined patient. -No new findings. No new orders. -Continue diabetic foot care principles. Literature dispensed on today.  -Toenails 1-5 b/l were debrided in length and girth with sterile nail nippers and dremel without iatrogenic bleeding.  -Callus(es) L hallux and R hallux pared utilizing sterile scalpel blade without complication or incident. Total number debrided =2. -Painful porokeratotic lesion(s) plantar aspect right heel pared and enucleated with sterile scalpel blade without incident. -Patient to continue soft, supportive shoe gear daily. -Patient to report any pedal injuries to medical professional immediately. -Patient/POA to call should there be question/concern in the interim.  Return in about 3 months (around 02/13/2020) for diabetic nail trim.  Marzetta Board, DPM

## 2020-02-22 ENCOUNTER — Other Ambulatory Visit: Payer: Self-pay

## 2020-02-22 ENCOUNTER — Ambulatory Visit (INDEPENDENT_AMBULATORY_CARE_PROVIDER_SITE_OTHER): Payer: Medicare HMO | Admitting: Podiatry

## 2020-02-22 ENCOUNTER — Encounter: Payer: Self-pay | Admitting: Podiatry

## 2020-02-22 DIAGNOSIS — K5900 Constipation, unspecified: Secondary | ICD-10-CM | POA: Insufficient documentation

## 2020-02-22 DIAGNOSIS — B351 Tinea unguium: Secondary | ICD-10-CM

## 2020-02-22 DIAGNOSIS — Z794 Long term (current) use of insulin: Secondary | ICD-10-CM | POA: Diagnosis not present

## 2020-02-22 DIAGNOSIS — M79674 Pain in right toe(s): Secondary | ICD-10-CM

## 2020-02-22 DIAGNOSIS — E119 Type 2 diabetes mellitus without complications: Secondary | ICD-10-CM | POA: Diagnosis not present

## 2020-02-22 DIAGNOSIS — M79675 Pain in left toe(s): Secondary | ICD-10-CM

## 2020-02-22 DIAGNOSIS — F1420 Cocaine dependence, uncomplicated: Secondary | ICD-10-CM | POA: Insufficient documentation

## 2020-02-27 NOTE — Progress Notes (Signed)
Subjective: Eric Wood presents today preventative diabetic foot care and callus(es) b/l feet and painful mycotic toenails b/l that are difficult to trim. Pain interferes with ambulation. Aggravating factors include wearing enclosed shoe gear. Pain is relieved with periodic professional debridement.   He voices no new pedal concerns on today's visit.  Sonia Side., FNP is patient's PCP. Last visit was December, 2021. He is also followed by the [facility] now. States the [employer] provides him with two pair of shoes/year.  He states he finally got his new teeth and feel much better.  No Known Allergies  Objective: Eric Wood is a pleasant 65 y.o. African American male in NAD. AAO x 3.  There were no vitals filed for this visit.  Vascular Examination: Neurovascular status unchanged b/l. Capillary refill time to digits immediate b/l. Palpable DP pulses b/l. Palpable PT pulses b/l. Pedal hair sparse b/l. Skin temperature gradient within normal limits b/l.  Dermatological Examination: Pedal skin with normal turgor, texture and tone bilaterally. No open wounds bilaterally. No interdigital macerations bilaterally. Toenails 1-5 b/l elongated, discolored, dystrophic, thickened, crumbly with subungual debris and tenderness to dorsal palpation. No hyperkeratotic nor porokeratotic lesions present on today's visit.  Musculoskeletal: Normal muscle strength 5/5 to all lower extremity muscle groups bilaterally. No gross bony deformities bilaterally. No pain crepitus or joint limitation noted with ROM b/l. Patient ambulates independent of any assistive aids.  Neurological Examination: Protective sensation intact 5/5 intact bilaterally with 10g monofilament b/l. Vibratory sensation intact b/l. Proprioception intact bilaterally.  Assessment: 1. Pain due to onychomycosis of toenails of both feet   2. Type 2 diabetes mellitus without complication, with long-term current use of  insulin ([place])     Plan: -Examined patient. -No new findings. No new orders. -Continue diabetic foot care principles. -Toenails 1-5 b/l were debrided in length and girth with sterile nail nippers and dremel without iatrogenic bleeding.  -Patient to report any pedal injuries to medical professional immediately. -Patient/POA to call should there be question/concern in the interim.  Return in about 3 months (around 05/21/2020).  Marzetta Board, DPM

## 2020-06-01 ENCOUNTER — Ambulatory Visit: Payer: Medicare HMO | Admitting: Podiatry

## 2020-06-08 ENCOUNTER — Ambulatory Visit: Payer: Medicare HMO | Admitting: Podiatry

## 2020-06-08 ENCOUNTER — Encounter: Payer: Self-pay | Admitting: Podiatry

## 2020-06-08 ENCOUNTER — Other Ambulatory Visit: Payer: Self-pay

## 2020-06-08 DIAGNOSIS — M5412 Radiculopathy, cervical region: Secondary | ICD-10-CM | POA: Insufficient documentation

## 2020-06-08 DIAGNOSIS — M180 Bilateral primary osteoarthritis of first carpometacarpal joints: Secondary | ICD-10-CM | POA: Insufficient documentation

## 2020-06-08 DIAGNOSIS — G4733 Obstructive sleep apnea (adult) (pediatric): Secondary | ICD-10-CM | POA: Insufficient documentation

## 2020-06-08 DIAGNOSIS — E7800 Pure hypercholesterolemia, unspecified: Secondary | ICD-10-CM | POA: Insufficient documentation

## 2020-06-08 DIAGNOSIS — M51379 Other intervertebral disc degeneration, lumbosacral region without mention of lumbar back pain or lower extremity pain: Secondary | ICD-10-CM | POA: Insufficient documentation

## 2020-06-08 DIAGNOSIS — D369 Benign neoplasm, unspecified site: Secondary | ICD-10-CM | POA: Insufficient documentation

## 2020-06-08 DIAGNOSIS — F339 Major depressive disorder, recurrent, unspecified: Secondary | ICD-10-CM | POA: Insufficient documentation

## 2020-06-08 DIAGNOSIS — L309 Dermatitis, unspecified: Secondary | ICD-10-CM | POA: Insufficient documentation

## 2020-06-08 DIAGNOSIS — L603 Nail dystrophy: Secondary | ICD-10-CM | POA: Insufficient documentation

## 2020-06-08 DIAGNOSIS — M79675 Pain in left toe(s): Secondary | ICD-10-CM | POA: Diagnosis not present

## 2020-06-08 DIAGNOSIS — B354 Tinea corporis: Secondary | ICD-10-CM | POA: Insufficient documentation

## 2020-06-08 DIAGNOSIS — M179 Osteoarthritis of knee, unspecified: Secondary | ICD-10-CM | POA: Insufficient documentation

## 2020-06-08 DIAGNOSIS — IMO0002 Reserved for concepts with insufficient information to code with codable children: Secondary | ICD-10-CM | POA: Insufficient documentation

## 2020-06-08 DIAGNOSIS — M779 Enthesopathy, unspecified: Secondary | ICD-10-CM | POA: Insufficient documentation

## 2020-06-08 DIAGNOSIS — M5137 Other intervertebral disc degeneration, lumbosacral region: Secondary | ICD-10-CM | POA: Insufficient documentation

## 2020-06-08 DIAGNOSIS — Z8659 Personal history of other mental and behavioral disorders: Secondary | ICD-10-CM | POA: Insufficient documentation

## 2020-06-08 DIAGNOSIS — H2513 Age-related nuclear cataract, bilateral: Secondary | ICD-10-CM | POA: Insufficient documentation

## 2020-06-08 DIAGNOSIS — Z794 Long term (current) use of insulin: Secondary | ICD-10-CM

## 2020-06-08 DIAGNOSIS — I10 Essential (primary) hypertension: Secondary | ICD-10-CM | POA: Insufficient documentation

## 2020-06-08 DIAGNOSIS — F331 Major depressive disorder, recurrent, moderate: Secondary | ICD-10-CM | POA: Insufficient documentation

## 2020-06-08 DIAGNOSIS — Z1389 Encounter for screening for other disorder: Secondary | ICD-10-CM | POA: Insufficient documentation

## 2020-06-08 DIAGNOSIS — F1010 Alcohol abuse, uncomplicated: Secondary | ICD-10-CM | POA: Insufficient documentation

## 2020-06-08 DIAGNOSIS — H269 Unspecified cataract: Secondary | ICD-10-CM | POA: Insufficient documentation

## 2020-06-08 DIAGNOSIS — E119 Type 2 diabetes mellitus without complications: Secondary | ICD-10-CM

## 2020-06-08 DIAGNOSIS — G629 Polyneuropathy, unspecified: Secondary | ICD-10-CM | POA: Insufficient documentation

## 2020-06-08 DIAGNOSIS — B351 Tinea unguium: Secondary | ICD-10-CM | POA: Diagnosis not present

## 2020-06-08 DIAGNOSIS — M25531 Pain in right wrist: Secondary | ICD-10-CM | POA: Insufficient documentation

## 2020-06-08 DIAGNOSIS — B360 Pityriasis versicolor: Secondary | ICD-10-CM | POA: Insufficient documentation

## 2020-06-08 DIAGNOSIS — M79674 Pain in right toe(s): Secondary | ICD-10-CM

## 2020-06-08 DIAGNOSIS — E8881 Metabolic syndrome: Secondary | ICD-10-CM | POA: Insufficient documentation

## 2020-06-08 DIAGNOSIS — E876 Hypokalemia: Secondary | ICD-10-CM | POA: Insufficient documentation

## 2020-06-08 DIAGNOSIS — F1910 Other psychoactive substance abuse, uncomplicated: Secondary | ICD-10-CM | POA: Insufficient documentation

## 2020-06-08 DIAGNOSIS — M170 Bilateral primary osteoarthritis of knee: Secondary | ICD-10-CM | POA: Insufficient documentation

## 2020-06-08 DIAGNOSIS — R000 Tachycardia, unspecified: Secondary | ICD-10-CM | POA: Insufficient documentation

## 2020-06-08 DIAGNOSIS — Z789 Other specified health status: Secondary | ICD-10-CM | POA: Insufficient documentation

## 2020-06-08 NOTE — Progress Notes (Signed)
This patient returns to my office for at risk foot care.  This patient requires this care by a professional since this patient will be at risk due to having neuropathy and diabetes.  This patient is unable to cut nails himself since the patient cannot reach his nails.These nails are painful walking and wearing shoes.  This patient presents for at risk foot care today.  General Appearance  Alert, conversant and in no acute stress.  Vascular  Dorsalis pedis and posterior tibial  pulses are palpable  bilaterally.  Capillary return is within normal limits  bilaterally. Temperature is within normal limits  bilaterally.  Neurologic  Senn-Weinstein monofilament wire test within normal limits  bilaterally. Muscle power within normal limits bilaterally.  Nails Thick disfigured discolored nails with subungual debris  from hallux to fifth toes bilaterally. No evidence of bacterial infection or drainage bilaterally.  Orthopedic  No limitations of motion  feet .  No crepitus or effusions noted.  No bony pathology or digital deformities noted.  Skin  normotropic skin with no porokeratosis noted bilaterally.  No signs of infections or ulcers noted.     Onychomycosis  Pain in right toes  Pain in left toes  Consent was obtained for treatment procedures.   Mechanical debridement of nails 1-5  bilaterally performed with a nail nipper.  Filed with dremel without incident.    Return office visit 3 months                    Told patient to return for periodic foot care and evaluation due to potential at risk complications.   Gardiner Barefoot DPM

## 2020-07-22 ENCOUNTER — Ambulatory Visit: Payer: Medicare HMO | Admitting: Podiatry

## 2020-09-14 ENCOUNTER — Ambulatory Visit: Payer: Medicare HMO | Admitting: Podiatry

## 2020-09-27 ENCOUNTER — Other Ambulatory Visit: Payer: Self-pay

## 2020-09-27 ENCOUNTER — Encounter: Payer: Self-pay | Admitting: Podiatry

## 2020-09-27 ENCOUNTER — Ambulatory Visit: Payer: Medicare HMO | Admitting: Podiatry

## 2020-09-27 DIAGNOSIS — B351 Tinea unguium: Secondary | ICD-10-CM

## 2020-09-27 DIAGNOSIS — M79675 Pain in left toe(s): Secondary | ICD-10-CM | POA: Diagnosis not present

## 2020-09-27 DIAGNOSIS — E119 Type 2 diabetes mellitus without complications: Secondary | ICD-10-CM | POA: Diagnosis not present

## 2020-09-27 DIAGNOSIS — Z794 Long term (current) use of insulin: Secondary | ICD-10-CM

## 2020-09-27 DIAGNOSIS — M79674 Pain in right toe(s): Secondary | ICD-10-CM

## 2020-09-27 NOTE — Progress Notes (Signed)
This patient returns to my office for at risk foot care.  This patient requires this care by a professional since this patient will be at risk due to having neuropathy and diabetes.  This patient is unable to cut nails himself since the patient cannot reach his nails.These nails are painful walking and wearing shoes.  This patient presents for at risk foot care today.  General Appearance  Alert, conversant and in no acute stress.  Vascular  Dorsalis pedis and posterior tibial  pulses are palpable  bilaterally.  Capillary return is within normal limits  bilaterally. Temperature is within normal limits  bilaterally.  Neurologic  Senn-Weinstein monofilament wire test within normal limits  bilaterally. Muscle power within normal limits bilaterally.  Nails Thick disfigured discolored nails with subungual debris  from hallux to fifth toes bilaterally. No evidence of bacterial infection or drainage bilaterally.  Orthopedic  No limitations of motion  feet .  No crepitus or effusions noted.  No bony pathology or digital deformities noted.  Skin  normotropic skin with no porokeratosis noted bilaterally.  No signs of infections or ulcers noted.     Onychomycosis  Pain in right toes  Pain in left toes  Consent was obtained for treatment procedures.   Mechanical debridement of nails 1-5  bilaterally performed with a nail nipper.  Filed with dremel without incident.    Return office visit 3 months                    Told patient to return for periodic foot care and evaluation due to potential at risk complications.   Gardiner Barefoot DPM

## 2020-12-28 ENCOUNTER — Encounter: Payer: Self-pay | Admitting: Podiatry

## 2020-12-28 ENCOUNTER — Other Ambulatory Visit: Payer: Self-pay

## 2020-12-28 ENCOUNTER — Ambulatory Visit: Payer: Medicare HMO | Admitting: Podiatry

## 2020-12-28 DIAGNOSIS — B351 Tinea unguium: Secondary | ICD-10-CM

## 2020-12-28 DIAGNOSIS — M79675 Pain in left toe(s): Secondary | ICD-10-CM

## 2020-12-28 DIAGNOSIS — E119 Type 2 diabetes mellitus without complications: Secondary | ICD-10-CM | POA: Diagnosis not present

## 2020-12-28 DIAGNOSIS — Z794 Long term (current) use of insulin: Secondary | ICD-10-CM | POA: Diagnosis not present

## 2020-12-28 DIAGNOSIS — M79674 Pain in right toe(s): Secondary | ICD-10-CM

## 2020-12-28 NOTE — Progress Notes (Signed)
This patient returns to my office for at risk foot care.  This patient requires this care by a professional since this patient will be at risk due to having neuropathy and diabetes.  This patient is unable to cut nails himself since the patient cannot reach his nails.These nails are painful walking and wearing shoes.  This patient presents for at risk foot care today.  General Appearance  Alert, conversant and in no acute stress.  Vascular  Dorsalis pedis and posterior tibial  pulses are palpable  bilaterally.  Capillary return is within normal limits  bilaterally. Temperature is within normal limits  bilaterally.  Neurologic  Senn-Weinstein monofilament wire test within normal limits  bilaterally. Muscle power within normal limits bilaterally.  Nails Thick disfigured discolored nails with subungual debris  from hallux to fifth toes bilaterally. No evidence of bacterial infection or drainage bilaterally.  Orthopedic  No limitations of motion  feet .  No crepitus or effusions noted.  No bony pathology or digital deformities noted.  Skin  normotropic skin with no porokeratosis noted bilaterally.  No signs of infections or ulcers noted.     Onychomycosis  Pain in right toes  Pain in left toes  Consent was obtained for treatment procedures.   Mechanical debridement of nails 1-5  bilaterally performed with a nail nipper.  Filed with dremel without incident.    Return office visit 3 months                    Told patient to return for periodic foot care and evaluation due to potential at risk complications.   Gardiner Barefoot DPM

## 2021-03-29 ENCOUNTER — Encounter: Payer: Self-pay | Admitting: Podiatry

## 2021-03-29 ENCOUNTER — Ambulatory Visit: Payer: Medicare PPO | Admitting: Podiatry

## 2021-03-29 ENCOUNTER — Other Ambulatory Visit: Payer: Self-pay

## 2021-03-29 DIAGNOSIS — B351 Tinea unguium: Secondary | ICD-10-CM

## 2021-03-29 DIAGNOSIS — E119 Type 2 diabetes mellitus without complications: Secondary | ICD-10-CM | POA: Diagnosis not present

## 2021-03-29 DIAGNOSIS — M79675 Pain in left toe(s): Secondary | ICD-10-CM | POA: Diagnosis not present

## 2021-03-29 DIAGNOSIS — M79674 Pain in right toe(s): Secondary | ICD-10-CM | POA: Diagnosis not present

## 2021-03-29 DIAGNOSIS — Z794 Long term (current) use of insulin: Secondary | ICD-10-CM

## 2021-03-29 NOTE — Progress Notes (Signed)
This patient returns to my office for at risk foot care.  This patient requires this care by a professional since this patient will be at risk due to having neuropathy and diabetes.  This patient is unable to cut nails himself since the patient cannot reach his nails.These nails are painful walking and wearing shoes.  This patient presents for at risk foot care today. ? ?General Appearance  Alert, conversant and in no acute stress. ? ?Vascular  Dorsalis pedis and posterior tibial  pulses are palpable  bilaterally.  Capillary return is within normal limits  bilaterally. Temperature is within normal limits  bilaterally. ? ?Neurologic  Senn-Weinstein monofilament wire test within normal limits  bilaterally. Muscle power within normal limits bilaterally. ? ?Nails Thick disfigured discolored nails with subungual debris  from hallux to fifth toes bilaterally. No evidence of bacterial infection or drainage bilaterally. ? ?Orthopedic  No limitations of motion  feet .  No crepitus or effusions noted.  No bony pathology or digital deformities noted. ? ?Skin  normotropic skin with no porokeratosis noted bilaterally.  No signs of infections or ulcers noted.    ? ?Onychomycosis  Pain in right toes  Pain in left toes ? ?Consent was obtained for treatment procedures.   Mechanical debridement of nails 1-5  bilaterally performed with a nail nipper.  Filed with dremel without incident.  ? ? ?Return office visit 3 months                    Told patient to return for periodic foot care and evaluation due to potential at risk complications. ? ? ?Gardiner Barefoot DPM  ?

## 2021-07-05 ENCOUNTER — Ambulatory Visit (INDEPENDENT_AMBULATORY_CARE_PROVIDER_SITE_OTHER): Payer: Medicare PPO | Admitting: Podiatry

## 2021-07-05 ENCOUNTER — Encounter: Payer: Self-pay | Admitting: Podiatry

## 2021-07-05 DIAGNOSIS — B353 Tinea pedis: Secondary | ICD-10-CM

## 2021-07-05 DIAGNOSIS — Z794 Long term (current) use of insulin: Secondary | ICD-10-CM

## 2021-07-05 DIAGNOSIS — B351 Tinea unguium: Secondary | ICD-10-CM

## 2021-07-05 MED ORDER — ZEASORB-AF 2 % EX POWD: CUTANEOUS | 2 refills | Status: AC | PRN

## 2021-07-10 NOTE — Progress Notes (Signed)
  Subjective:  Patient ID: Eric Wood, male    DOB: 1955-07-15,  MRN: [id number]  Abanoub Hanken presents to clinic today for preventative diabetic foot care and callus(es) b/l lower extremities and painful thick toenails that are difficult to trim. Painful toenails interfere with ambulation. Aggravating factors include wearing enclosed shoe gear. Pain is relieved with periodic professional debridement. Painful calluses are aggravated when weightbearing with and without shoegear. Pain is relieved with periodic professional debridement.  Patient states blood glucose was 240 mg/dl today.  Last A1c was 8.7%.  He states he has been going to the [place] for his medical appointments.  New problem(s): None.   PCP is Sonia Side., FNP , and last visit was  January, 2023.  He is requesting refill on Zeasorb powder on today's visit.  Allergies  Allergen Reactions   Empagliflozin     Other reaction(s): Decreased renal function   Metformin Diarrhea    Review of Systems: Negative except as noted in the HPI.  Objective: No changes noted in today's physical examination.  Vascular Examination: Neurovascular status unchanged b/l. Capillary refill time to digits immediate b/l. Palpable DP pulses b/l. Palpable PT pulses b/l. Pedal hair sparse b/l. Skin temperature gradient within normal limits b/l.  Dermatological Examination: Pedal skin with normal turgor, texture and tone bilaterally. No open wounds bilaterally. No interdigital macerations bilaterally. Toenails 1-5 b/l elongated, discolored, dystrophic, thickened, crumbly with subungual debris and tenderness to dorsal palpation. No hyperkeratotic nor porokeratotic lesions present on today's visit. Mild interdigital maceration noted webspaces 1-4 b/l. No open wounds, no erythema, no edema b/l.  Musculoskeletal: Normal muscle strength 5/5 to all lower extremity muscle groups bilaterally. No gross bony deformities bilaterally. No pain crepitus or joint  limitation noted with ROM b/l. Patient ambulates independent of any assistive aids.  Neurological Examination: Protective sensation intact 5/5 intact bilaterally with 10g monofilament b/l. Vibratory sensation intact b/l. Proprioception intact bilaterally.  Assessment/Plan: 1. Pain due to onychomycosis of toenails of both feet   2. Tinea pedis of both feet   3. Type 2 diabetes mellitus without complication, with long-term current use of insulin ([facility])     -Examined patient. -Patient to continue soft, supportive shoe gear daily. -Toenails 1-5 b/l were debrided in length and girth with sterile nail nippers and dremel without iatrogenic bleeding.  -Rx written for Miconazole Powder 2% antifungal spray powder. Apply between toes once daily. -Patient/POA to call should there be question/concern in the interim.   Return in about 3 months (around 10/05/2021).  Marzetta Board, DPM

## 2021-09-12 ENCOUNTER — Ambulatory Visit: Payer: No Typology Code available for payment source | Admitting: Orthopaedic Surgery

## 2021-10-03 ENCOUNTER — Ambulatory Visit (INDEPENDENT_AMBULATORY_CARE_PROVIDER_SITE_OTHER): Payer: Medicare (Managed Care) | Admitting: Podiatry

## 2021-10-03 ENCOUNTER — Encounter: Payer: Self-pay | Admitting: Podiatry

## 2021-10-03 DIAGNOSIS — M79675 Pain in left toe(s): Secondary | ICD-10-CM

## 2021-10-03 DIAGNOSIS — Z794 Long term (current) use of insulin: Secondary | ICD-10-CM | POA: Diagnosis not present

## 2021-10-03 DIAGNOSIS — E119 Type 2 diabetes mellitus without complications: Secondary | ICD-10-CM | POA: Diagnosis not present

## 2021-10-03 DIAGNOSIS — M79674 Pain in right toe(s): Secondary | ICD-10-CM

## 2021-10-03 DIAGNOSIS — B351 Tinea unguium: Secondary | ICD-10-CM | POA: Diagnosis not present

## 2021-10-03 NOTE — Progress Notes (Signed)
  Subjective:  Patient ID: Eric Wood, male    DOB: [birth year],  MRN: [id number]  Eric Wood presents to clinic today for:  Chief Complaint  Patient presents with   foot care     diabetic nail care   New problem(s): None.   PCP is Sonia Side., FNP.  Allergies  Allergen Reactions   Empagliflozin     Other reaction(s): Decreased renal function   Metformin Diarrhea    Review of Systems: Negative except as noted in the HPI.  Objective:   [name] is a pleasant 66 y.o. male WD, WN in NAD. AAO x 3.  Vascular Examination: Neurovascular status unchanged b/l. Capillary refill time to digits immediate b/l. Palpable DP pulses b/l. Palpable PT pulses b/l. Pedal hair sparse b/l. Skin temperature gradient within normal limits b/l.  Dermatological Examination: Pedal skin with normal turgor, texture and tone bilaterally. No open wounds bilaterally. No interdigital macerations bilaterally. Toenails 1-5 b/l elongated, discolored, dystrophic, thickened, crumbly with subungual debris and tenderness to dorsal palpation. No hyperkeratotic nor porokeratotic lesions present on today's visit.   Musculoskeletal: Normal muscle strength 5/5 to all lower extremity muscle groups bilaterally. No gross bony deformities bilaterally. No pain crepitus or joint limitation noted with ROM b/l. Patient ambulates independent of any assistive aids.  Neurological Examination: Protective sensation intact 5/5 intact bilaterally with 10g monofilament b/l. Vibratory sensation intact b/l. Proprioception intact bilaterally.  Assessment/Plan: 1. Pain due to onychomycosis of toenails of both feet   2. Type 2 diabetes mellitus without complication, with long-term current use of insulin ([place])     No orders of the defined types were placed in this encounter.   -Consent given for treatment as described below: -Examined patient. -Continue foot and shoe inspections daily. Monitor blood glucose per  PCP/Endocrinologist's recommendations. -Patient to continue soft, supportive shoe gear daily. -Toenails 1-5 b/l were debrided in length and girth with sterile nail nippers and dremel without iatrogenic bleeding.  -Patient/POA to call should there be question/concern in the interim.   Return in about 3 months (around 01/03/2022).  Marzetta Board, DPM

## 2022-01-17 ENCOUNTER — Encounter: Payer: Self-pay | Admitting: Podiatry

## 2022-01-17 ENCOUNTER — Ambulatory Visit (INDEPENDENT_AMBULATORY_CARE_PROVIDER_SITE_OTHER): Payer: Medicare HMO | Admitting: Podiatry

## 2022-01-17 VITALS — BP 156/89

## 2022-01-17 DIAGNOSIS — E119 Type 2 diabetes mellitus without complications: Secondary | ICD-10-CM

## 2022-01-17 DIAGNOSIS — B351 Tinea unguium: Secondary | ICD-10-CM

## 2022-01-17 DIAGNOSIS — M79675 Pain in left toe(s): Secondary | ICD-10-CM | POA: Diagnosis not present

## 2022-01-17 DIAGNOSIS — E1142 Type 2 diabetes mellitus with diabetic polyneuropathy: Secondary | ICD-10-CM

## 2022-01-17 DIAGNOSIS — M79674 Pain in right toe(s): Secondary | ICD-10-CM | POA: Diagnosis not present

## 2022-01-17 NOTE — Progress Notes (Signed)
ANNUAL DIABETIC FOOT EXAM  Subjective: Eric Wood presents today for annual diabetic foot examination.  Chief Complaint  Patient presents with   Nail Problem    [facility] BS-114 A1C-7.4 Corrie Dandy PCP VST-12/2021   Patient confirms h/o diabetes.  Patient denies any h/o foot wounds.  Risk factors: diabetes, neuropathy, HTN, hypercholesterolemia.  Sonia Side., FNP is patient's PCP.  Past Medical History:  Diagnosis Date   Arthritis    Back pain    acupuncture   Diabetes mellitus    type 2   Family history of adverse reaction to anesthesia    Severe PONV   Hypercholesteremia    under control   Hypertension    Sleep apnea    sometimes wear CPAP    Patient Active Problem List   Diagnosis Date Noted   Age-related nuclear cataract, bilateral 06/08/2020   Alcohol abuse 06/08/2020   Bilateral primary osteoarthritis of first carpometacarpal joints 06/08/2020   Bilateral primary osteoarthritis of knee 06/08/2020   Brachial neuritis 06/08/2020   Cataract 06/08/2020   Degeneration of lumbosacral intervertebral disc 06/08/2020   Tinea corporis 94/17/4081   Dysmetabolic syndrome X 44/81/8563   Eczema 06/08/2020   Encounter for screening for other disorder 06/08/2020   Enthesopathy 06/08/2020   Benign essential hypertension 06/08/2020   H/O: depression 06/08/2020   Hypopotassemia 06/08/2020   Major depressive disorder, recurrent, unspecified ([place]) 06/08/2020   Nail dystrophy 06/08/2020   Neuropathy 06/08/2020   Obstructive sleep apnea 06/08/2020   Osteoarthritis of knee 06/08/2020   Pure hypercholesterolemia 06/08/2020   Other specified health status 06/08/2020   Other, mixed, or unspecified nondependent drug abuse, unspecified 06/08/2020   Pain in right wrist 06/08/2020   Recurrent major depressive episodes, moderate ([place]) 06/08/2020   Tachycardia 06/08/2020   Thoracic or lumbosacral neuritis or radiculitis 06/08/2020   Tinea versicolor 06/08/2020   Tubular  adenoma 06/08/2020   Cocaine dependence ([place]) 02/22/2020   Constipation, unspecified 02/22/2020   Diabetes mellitus ([place]) 02/22/2020   Mood disorder ([place]) 09/08/2014   Osteoarthritis of left hip 08/20/2014   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   Osteoarthritis of right hip 06/11/2014   Status post total replacement of right hip 06/11/2014   Past Surgical History:  Procedure Laterality Date   acupuncture     For back and hip   COLONOSCOPY W/ POLYPECTOMY     NO PAST SURGERIES     TOTAL HIP ARTHROPLASTY Right 06/11/2014   Procedure: RIGHT TOTAL HIP ARTHROPLASTY ANTERIOR APPROACH;  Surgeon: Mcarthur Rossetti, MD;  Location: [facility];  Service: Orthopedics;  Laterality: Right;   TOTAL HIP ARTHROPLASTY Left 08/20/2014   Procedure: LEFT TOTAL HIP ARTHROPLASTY ANTERIOR APPROACH;  Surgeon: Mcarthur Rossetti, MD;  Location: [facility];  Service: Orthopedics;  Laterality: Left;   Current Outpatient Medications on File Prior to Visit  Medication Sig Dispense Refill   acetaminophen (TYLENOL) 325 MG tablet Take 325 mg by mouth every 6 (six) hours as needed for moderate pain.     aspirin EC 325 MG EC tablet Take 1 tablet (325 mg total) by mouth 2 (two) times daily after a meal. 30 tablet 0   atenolol (TENORMIN) 25 MG tablet Take 25 mg by mouth every morning.      benzoyl peroxide 10 % gel Apply topically.     clotrimazole (LOTRIMIN) 1 % cream Apply to both feet and between toes twice daily for 4 weeks. 30 g 1   dextrose (GLUTOSE) 40 % GEL  Take by mouth.     gabapentin (NEURONTIN) 300 MG capsule Take 300 mg by mouth 2 (two) times daily.     glipiZIDE (GLUCOTROL) 10 MG tablet Take 10 mg by mouth 2 (two) times daily before a meal.     hydrochlorothiazide (HYDRODIURIL) 25 MG tablet TAKE 1 TABLET BY MOUTH ONCE DAILY FOR HIGH BLOOD PRESSURE FOR 90 DAYS     hydrocortisone 2.5 % cream Apply topically.     insulin aspart (NOVOLOG) 100 UNIT/ML injection Inject into the skin.     insulin  glargine (LANTUS) 100 UNIT/ML injection Inject into the skin.     ketoconazole (NIZORAL) 2 % shampoo Apply topically.     lisinopril (PRINIVIL,ZESTRIL) 10 MG tablet Take 20 mg by mouth every morning.      lisinopril (ZESTRIL) 40 MG tablet TAKE 1 TABLET BY MOUTH ONCE DAILY FOR HIGH BLOOD PRESSURE FOR 90 DAYS     lisinopril-hydrochlorothiazide (ZESTORETIC) 20-12.5 MG tablet Take by mouth.     loratadine (CLARITIN) 10 MG tablet Take 1 tablet by mouth daily.     meloxicam (MOBIC) 15 MG tablet Take 1 tablet (15 mg total) by mouth daily. 30 tablet 0   metFORMIN (GLUCOPHAGE) 500 MG tablet Take 500 mg by mouth 2 (two) times daily with a meal.     miconazole (ZEASORB-AF) 2 % powder Apply topically as needed for itching. 85 g 2   PRESCRIPTION MEDICATION Apply 1 application topically 2 (two) times daily as needed (dry skin). Lotion he receives from the [place]     selenium sulfide (SELSUN) 2.5 % shampoo Apply topically.     senna (SENOKOT) 8.6 MG TABS tablet Take 1 tablet by mouth daily as needed for mild constipation.     sertraline (ZOLOFT) 25 MG tablet Take 25 mg by mouth daily.     sildenafil (VIAGRA) 100 MG tablet Take by mouth.     simvastatin (ZOCOR) 10 MG tablet Take 10 mg by mouth at bedtime.     simvastatin (ZOCOR) 40 MG tablet TAKE 1 TABLET BY MOUTH ONCE DAILY IN THE EVENING FOR 90 DAYS     terbinafine (LAMISIL) 1 % cream Apply topically.     tiZANidine (ZANAFLEX) 4 MG tablet TAKE 1 TABLET (4 MG TOTAL) BY MOUTH EVERY 8 (EIGHT) HOURS AS NEEDED FOR MUSCLE SPASMS. 60 tablet 0   UNABLE TO FIND C Pap     No current facility-administered medications on file prior to visit.    Allergies  Allergen Reactions   Empagliflozin     Other reaction(s): Decreased renal function   Metformin Diarrhea   Social History   Occupational History   Not on file  Tobacco Use   Smoking status: Never   Smokeless tobacco: Never  Substance and Sexual Activity   Alcohol use: Yes    Comment: occ beer    Drug use: No     Types: "Crack" cocaine    Comment: last time 1-2 years ago   Sexual activity: Not Currently   History reviewed. No pertinent family history. Immunization History  Administered Date(s) Administered   Influenza, High Dose Seasonal PF 11/13/2015   Influenza, Seasonal, Injecte, Preservative Fre 11/29/2009, 11/19/2012, 03/09/2015   Influenza,inj,Quad PF,6+ Mos 12/07/2015, 10/04/2016, 10/24/2017   Influenza-Unspecified 10/01/2001, 11/29/2003, 10/21/2004, 10/29/2005, 11/20/2006, 09/09/2007, 10/26/2008, 11/01/2008, 09/26/2010, 09/11/2011, 10/04/2016, 11/10/2018, 09/11/2019   PFIZER Comirnaty(Gray Top)Covid-19 Tri-Sucrose Vaccine 04/13/2020   PFIZER(Purple Top)SARS-COV-2 Vaccination 02/12/2019, 03/09/2019, 12/21/2019   Pneumococcal Polysaccharide-23 11/29/2003, 10/02/2006, 05/24/2015, 10/27/2019   Tdap 03/06/2011   Zoster  Recombinat (Shingrix) 10/27/2019, 02/18/2020   Zoster, Live 12/07/2015     Review of Systems: Negative except as noted in the HPI.   Objective: Vitals:   01/17/22 0851  BP: (!) 156/89    Hershel Corkery is a pleasant 67 y.o. male in NAD. AAO X 3.  Vascular Examination: CFT immediate b/l LE. Palpable DP/PT pulses b/l LE. Digital hair sparse b/l. Skin temperature gradient WNL b/l. No pain with calf compression b/l. No edema noted b/l. No cyanosis or clubbing noted b/l LE.  Dermatological Examination: Pedal integument with normal turgor, texture and tone b/l LE. No open wounds b/l. No interdigital macerations b/l. Toenails 1-5 b/l elongated, thickened, discolored with subungual debris. +Tenderness with dorsal palpation of nailplates. No hyperkeratotic or porokeratotic lesions present.  Neurological Examination: Pt has subjective symptoms of neuropathy. Protective sensation intact 5/5 intact bilaterally with 10g monofilament b/l. Vibratory sensation intact b/l. Proprioception intact bilaterally.  Musculoskeletal Examination: Normal muscle strength 5/5 to all lower  extremity muscle groups bilaterally. No pain, crepitus or joint limitation noted with ROM b/l LE. No gross bony pedal deformities b/l. Patient ambulates independently without assistive aids.  Footwear Assessment: Does the patient wear appropriate shoes? Yes. Does the patient need inserts/orthotics? No.  ADA Risk Categorization: Low Risk :  Patient has all of the following: Intact protective sensation No prior foot ulcer  No severe deformity Pedal pulses present  Assessment: 1. Pain due to onychomycosis of toenails of both feet   2. Type 2 diabetes mellitus without complication, with long-term current use of insulin ([place])   3. Encounter for diabetic foot exam [facility])     Plan: -Patient was evaluated and treated. All patient's and/or POA's questions/concerns answered on today's visit. -Diabetic foot examination performed today. -Discussed and educated patient on diabetic foot care, especially with  regards to the vascular, neurological and musculoskeletal systems. -Patient to continue soft, supportive shoe gear daily. -Toenails 1-5 b/l were debrided in length and girth with sterile nail nippers and dremel without iatrogenic bleeding.  -Patient/POA to call should there be question/concern in the interim. Return in about 3 months (around 04/18/2022).  Marzetta Board, DPM

## 2022-01-31 ENCOUNTER — Ambulatory Visit (INDEPENDENT_AMBULATORY_CARE_PROVIDER_SITE_OTHER): Payer: Medicare HMO

## 2022-01-31 ENCOUNTER — Encounter: Payer: Self-pay | Admitting: Orthopaedic Surgery

## 2022-01-31 ENCOUNTER — Ambulatory Visit (INDEPENDENT_AMBULATORY_CARE_PROVIDER_SITE_OTHER): Payer: Medicare HMO | Admitting: Orthopaedic Surgery

## 2022-01-31 DIAGNOSIS — M79605 Pain in left leg: Secondary | ICD-10-CM | POA: Diagnosis not present

## 2022-01-31 DIAGNOSIS — Z96641 Presence of right artificial hip joint: Secondary | ICD-10-CM

## 2022-01-31 DIAGNOSIS — Z96642 Presence of left artificial hip joint: Secondary | ICD-10-CM
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
## 2022-01-31 NOTE — Addendum Note (Signed)
Addended by: Robyne Peers on: 01/31/2022 02:27 PM   Modules accepted: Orders

## 2022-02-08 ENCOUNTER — Ambulatory Visit (INDEPENDENT_AMBULATORY_CARE_PROVIDER_SITE_OTHER): Payer: Medicare HMO | Admitting: Physical Therapy

## 2022-02-08 ENCOUNTER — Encounter: Payer: Self-pay | Admitting: Physical Therapy

## 2022-02-08 ENCOUNTER — Other Ambulatory Visit: Payer: Self-pay

## 2022-02-08 DIAGNOSIS — M5459 Other low back pain: Secondary | ICD-10-CM | POA: Diagnosis not present

## 2022-02-08 DIAGNOSIS — R29898 Other symptoms and signs involving the musculoskeletal system: Secondary | ICD-10-CM

## 2022-02-08 NOTE — Therapy (Signed)
OUTPATIENT PHYSICAL THERAPY THORACOLUMBAR EVALUATION   Patient Name: Eric Wood MRN: [id number] DOB:1955/07/02, 67 y.o., male Today's Date: 02/08/2022  END OF SESSION:  PT End of Session - 02/08/22 1010     Visit Number 1    Number of Visits 3    Date for PT Re-Evaluation 03/22/22    Authorization Type [employer] $10 copay    Progress Note Due on Visit 10    PT Start Time 0930    PT Stop Time 1000    PT Time Calculation (min) 30 min    Activity Tolerance Patient tolerated treatment well    Behavior During Therapy WFL for tasks assessed/performed             Past Medical History:  Diagnosis Date   Arthritis    Back pain    acupuncture   Diabetes mellitus    type 2   Family history of adverse reaction to anesthesia    Severe PONV   Hypercholesteremia    under control   Hypertension    Sleep apnea    sometimes wear CPAP    Past Surgical History:  Procedure Laterality Date   acupuncture     For back and hip   COLONOSCOPY W/ POLYPECTOMY     NO PAST SURGERIES     TOTAL HIP ARTHROPLASTY Right 06/11/2014   Procedure: RIGHT TOTAL HIP ARTHROPLASTY ANTERIOR APPROACH;  Surgeon: Mcarthur Rossetti, MD;  Location: [facility];  Service: Orthopedics;  Laterality: Right;   TOTAL HIP ARTHROPLASTY Left 08/20/2014   Procedure: LEFT TOTAL HIP ARTHROPLASTY ANTERIOR APPROACH;  Surgeon: Mcarthur Rossetti, MD;  Location: [facility];  Service: Orthopedics;  Laterality: Left;   Patient Active Problem List   Diagnosis Date Noted   Age-related nuclear cataract, bilateral 06/08/2020   Alcohol abuse 06/08/2020   Bilateral primary osteoarthritis of first carpometacarpal joints 06/08/2020   Bilateral primary osteoarthritis of knee 06/08/2020   Brachial neuritis 06/08/2020   Cataract 06/08/2020   Degeneration of lumbosacral intervertebral disc 06/08/2020   Tinea corporis 78/29/5621   Dysmetabolic syndrome X 30/86/5784   Eczema 06/08/2020   Encounter for screening for other disorder  06/08/2020   Enthesopathy 06/08/2020   Benign essential hypertension 06/08/2020   H/O: depression 06/08/2020   Hypopotassemia 06/08/2020   Major depressive disorder, recurrent, unspecified ([place]) 06/08/2020   Nail dystrophy 06/08/2020   Neuropathy 06/08/2020   Obstructive sleep apnea 06/08/2020   Osteoarthritis of knee 06/08/2020   Pure hypercholesterolemia 06/08/2020   Other specified health status 06/08/2020   Other, mixed, or unspecified nondependent drug abuse, unspecified 06/08/2020   Pain in right wrist 06/08/2020   Recurrent major depressive episodes, moderate ([place]) 06/08/2020   Tachycardia 06/08/2020   Thoracic or lumbosacral neuritis or radiculitis 06/08/2020   Tinea versicolor 06/08/2020   Tubular adenoma 06/08/2020   Cocaine dependence ([place]) 02/22/2020   Constipation, unspecified 02/22/2020   Diabetes mellitus ([place]) 02/22/2020   Mood disorder ([place]) 09/08/2014   Osteoarthritis of left hip 08/20/2014   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   Osteoarthritis of right hip 06/11/2014   Status post total replacement of right hip 06/11/2014    PCP: Dustin Folks, FNP  REFERRING PROVIDER: Pete Pelt, PA-C  REFERRING DIAG: [phone] (ICD-10-CM) - Status post total replacement of left hip M79.605 (ICD-10-CM) - Pain of left lower extremity  Rationale for Evaluation and Treatment: Rehabilitation  THERAPY DIAG:  Other low back pain - Plan: PT plan of care cert/re-cert  Other symptoms and  signs involving the musculoskeletal system - Plan: PT plan of care cert/re-cert  ONSET DATE: 3 months ago  SUBJECTIVE:                                                                                                                                                                                           SUBJECTIVE STATEMENT: Pt reports he was sent here because "he's a little stiff."  He sees acupuncturist monthly to help with neck and back pain, which he reports his  helpful.  PERTINENT HISTORY:  PMH: OA, DM, HTN, Sleep apnea, hx bil THA (2016), hx ETOH abuse, hx cocaine dependence  PAIN:  Are you having pain? Yes: NPRS scale: 0 currently; up to 4-5/10 Pain location: low back Pain description: dull Aggravating factors: lifting heavy stuff Relieving factors: sitting, rest  PRECAUTIONS: None  WEIGHT BEARING RESTRICTIONS: No  FALLS:  Has patient fallen in last 6 months? No  LIVING ENVIRONMENT: Lives with:  two brothers Lives in: House/apartment Stairs: Yes: External: 3-4 steps; can reach both  OCCUPATION: Retired; Actor  PLOF: Independent and Leisure: goes to [employer] (1 mile walking), ab machine  PATIENT GOALS: improve stiffness  NEXT MD VISIT: 04/02/22  OBJECTIVE:   DIAGNOSTIC FINDINGS:  X-Rays: Severe degenerative disc disease at L5-S1. Progression of the degenerative disc disease at L5-S1 from prior films obtained 07/26/2016  PATIENT SURVEYS:  02/08/22: FOTO 72 (predicted 72)  SCREENING FOR RED FLAGS: Bowel or bladder incontinence: No Spinal tumors: No  COGNITION: Overall cognitive status: Within functional limits for tasks assessed     SENSATION: 02/08/22: WFL  MUSCLE LENGTH: 02/08/22 Hamstrings: tightness bil   POSTURE: rounded shoulders, forward head, and decreased lumbar lordosis  LUMBAR ROM:   AROM eval  Flexion Limited 25%  Extension Limited 25%  Right Quadrant WNL  Left Quadrant WNL   (Blank rows = not tested)  LOWER EXTREMITY ROM:   Lt hip tightness noted throughout, flexion and extension most impaired  Active  Left eval  Hip flexion Limited to ~ 80 deg  Hip extension Limited to ~ 0-5 deg  Hip abduction   Hip adduction   Hip internal rotation   Hip external rotation    (Blank rows = not tested)  LOWER EXTREMITY MMT:    MMT Right eval Left eval  Hip flexion 4/5 5/5  Hip extension    Hip abduction    Hip adduction    Hip internal rotation    Hip external rotation    Knee flexion 5/5  5/5  Knee extension 5/5 5/5   (Blank rows = not tested)  LUMBAR SPECIAL TESTS:  02/08/22:  Slump test: Negative  GAIT: 02/08/22 Comments: Independent with amb  TODAY'S TREATMENT:                                                                                                                              DATE:  02/08/22 See HEP - demonstrated with trial reps performed with mod cues    PATIENT EDUCATION:  Education details: HEP Person educated: Patient Education method: Explanation, Demonstration, and Handouts Education comprehension: verbalized understanding, returned demonstration, and needs further education  HOME EXERCISE PROGRAM: Access Code: [email or web address] URL: [email or web address] Date: 02/08/2022 Prepared by: Faustino Congress  Exercises - Leg Swing Single Leg Balance  - 2 x daily - 7 x weekly - 1-2 sets - 10 reps - 3-5 sec hold - Standing Single Leg Hip ER   - 2 x daily - 7 x weekly - 1-2 sets - 10 reps - 3-5 hold - Hooklying Single Knee to Chest Stretch with Towel  - 2 x daily - 7 x weekly - 1 sets - 3 reps - 30 sec hold - Supine Hip Adductor Stretch  - 2 x daily - 7 x weekly - 1 sets - 5 reps - 15 sec hold - Supine Hamstring Stretch  - 2 x daily - 7 x weekly - 1 sets - 5 reps - 15 sec hold  ASSESSMENT:  CLINICAL IMPRESSION: Patient is a 67 y.o. male who was seen today for physical therapy evaluation and treatment for Lt hip pain.  He demonstrates decreased flexibility and ROM affecting functional mobility.  He will benefit from PT to address deficits listed.   OBJECTIVE IMPAIRMENTS: decreased ROM, decreased strength, hypomobility, increased fascial restrictions, increased muscle spasms, and pain.   ACTIVITY LIMITATIONS: standing, squatting, transfers, and locomotion level  PARTICIPATION LIMITATIONS: community activity and yard work  PERSONAL FACTORS: PMH: OA, DM, HTN, Sleep apnea, hx bil THA (2016), ETOH abuse, cocaine dependence are also affecting patient's  functional outcome.   REHAB POTENTIAL: Good  CLINICAL DECISION MAKING: Evolving/moderate complexity  EVALUATION COMPLEXITY: Moderate   GOALS: Goals reviewed with patient? Yes  SHORT TERM GOALS: Target date: 03/01/2022  Independent with initial HEP Goal status: INITIAL  LONG TERM GOALS: Target date: 03/22/2022  Independent with final HEP Goal status: INITIAL  2.  FOTO score maintained Goal status: INITIAL  3.  Lt hip flexion improved to 90 deg active for improved function and flexibility Goal status: INIITAL  4.  Report pain 50% improvement in symptoms for improved function Goal status: INITIAL   PLAN:  PT FREQUENCY: every other week  PT DURATION: 6 weeks  PLANNED INTERVENTIONS: Therapeutic exercises, Therapeutic activity, Neuromuscular re-education, Gait training, Patient/Family education, Self Care, Joint mobilization, Stair training, Aquatic Therapy, Dry Needling, Electrical stimulation, Cryotherapy, Moist heat, Taping, Ultrasound, Ionotophoresis '4mg'$ /ml Dexamethasone, Manual therapy, and Re-evaluation.  PLAN FOR NEXT SESSION: review HEP, give gym program as pt requesting    Laureen Abrahams, PT, DPT 02/08/22 10:12 AM

## 2022-02-21 ENCOUNTER — Ambulatory Visit (INDEPENDENT_AMBULATORY_CARE_PROVIDER_SITE_OTHER): Payer: Medicare HMO | Admitting: Physical Therapy

## 2022-02-21 ENCOUNTER — Encounter: Payer: Self-pay | Admitting: Physical Therapy

## 2022-02-21 DIAGNOSIS — M5459 Other low back pain: Secondary | ICD-10-CM | POA: Diagnosis not present

## 2022-02-21 DIAGNOSIS — R29898 Other symptoms and signs involving the musculoskeletal system: Secondary | ICD-10-CM

## 2022-02-21 NOTE — Therapy (Signed)
OUTPATIENT PHYSICAL THERAPY TREATMENT NOTE   Patient Name: Eric Wood MRN: [id number] DOB:[birth year], 67 y.o., male Today's Date: 02/21/2022  END OF SESSION:   PT End of Session - 02/21/22 1129     Visit Number 2    Number of Visits 3    Date for PT Re-Evaluation 03/22/22    Authorization Type [employer] $10 copay    Progress Note Due on Visit 10    PT Start Time 1130    PT Stop Time 1215    PT Time Calculation (min) 45 min    Activity Tolerance Patient tolerated treatment well    Behavior During Therapy WFL for tasks assessed/performed             Past Medical History:  Diagnosis Date   Arthritis    Back pain    acupuncture   Diabetes mellitus    type 2   Family history of adverse reaction to anesthesia    Severe PONV   Hypercholesteremia    under control   Hypertension    Sleep apnea    sometimes wear CPAP    Past Surgical History:  Procedure Laterality Date   acupuncture     For back and hip   COLONOSCOPY W/ POLYPECTOMY     NO PAST SURGERIES     TOTAL HIP ARTHROPLASTY Right 06/11/2014   Procedure: RIGHT TOTAL HIP ARTHROPLASTY ANTERIOR APPROACH;  Surgeon: Mcarthur Rossetti, MD;  Location: [facility];  Service: Orthopedics;  Laterality: Right;   TOTAL HIP ARTHROPLASTY Left 08/20/2014   Procedure: LEFT TOTAL HIP ARTHROPLASTY ANTERIOR APPROACH;  Surgeon: Mcarthur Rossetti, MD;  Location: [facility];  Service: Orthopedics;  Laterality: Left;   Patient Active Problem List   Diagnosis Date Noted   Age-related nuclear cataract, bilateral 06/08/2020   Alcohol abuse 06/08/2020   Bilateral primary osteoarthritis of first carpometacarpal joints 06/08/2020   Bilateral primary osteoarthritis of knee 06/08/2020   Brachial neuritis 06/08/2020   Cataract 06/08/2020   Degeneration of lumbosacral intervertebral disc 06/08/2020   Tinea corporis [id number]   Dysmetabolic syndrome X [id number]   Eczema 06/08/2020   Encounter for screening for other disorder  06/08/2020   Enthesopathy 06/08/2020   Benign essential hypertension 06/08/2020   H/O: depression 06/08/2020   Hypopotassemia 06/08/2020   Major depressive disorder, recurrent, unspecified ([place]) 06/08/2020   Nail dystrophy 06/08/2020   Neuropathy 06/08/2020   Obstructive sleep apnea 06/08/2020   Osteoarthritis of knee 06/08/2020   Pure hypercholesterolemia 06/08/2020   Other specified health status 06/08/2020   Other, mixed, or unspecified nondependent drug abuse, unspecified 06/08/2020   Pain in right wrist 06/08/2020   Recurrent major depressive episodes, moderate ([place]) 06/08/2020   Tachycardia 06/08/2020   Thoracic or lumbosacral neuritis or radiculitis 06/08/2020   Tinea versicolor 06/08/2020   Tubular adenoma 06/08/2020   Cocaine dependence ([place]) 02/22/2020   Constipation, unspecified 02/22/2020   Diabetes mellitus ([place]) 02/22/2020   Mood disorder ([place]) 09/08/2014   Osteoarthritis of left hip 08/20/2014   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   Osteoarthritis of right hip 06/11/2014   Status post total replacement of right hip 06/11/2014     THERAPY DIAG:  Other low back pain  Other symptoms and signs involving the musculoskeletal system   PCP: Dustin Folks, FNP  REFERRING PROVIDER: Pete Pelt, PA-C  REFERRING DIAG: [phone] (ICD-10-CM) - Status post total replacement of left hip M79.605 (ICD-10-CM) - Pain of left lower extremity  Rationale for Evaluation and Treatment:  Rehabilitation  THERAPY DIAG:  Other low back pain - Plan: PT plan of care cert/re-cert  Other symptoms and signs involving the musculoskeletal system - Plan: PT plan of care cert/re-cert  ONSET DATE: 3 months ago  SUBJECTIVE:                                                                                                                                                                                           SUBJECTIVE STATEMENT: Little sore and stiff from exercising  more   PERTINENT HISTORY:  PMH: OA, DM, HTN, Sleep apnea, hx bil THA (2016), hx ETOH abuse, hx cocaine dependence  PAIN:  Are you having pain? Yes: NPRS scale: 0 currently; up to 4-5/10 Pain location: low back Pain description: dull Aggravating factors: lifting heavy stuff Relieving factors: sitting, rest  PRECAUTIONS: None  WEIGHT BEARING RESTRICTIONS: No  FALLS:  Has patient fallen in last 6 months? No  LIVING ENVIRONMENT: Lives with: two brothers Lives in: House/apartment Stairs: Yes: External: 3-4 steps; can reach both  OCCUPATION: Retired; Actor  PLOF: Independent and Leisure: goes to [employer] (1 mile walking), ab machine  PATIENT GOALS: improve stiffness  NEXT MD VISIT: 04/02/22  OBJECTIVE:   DIAGNOSTIC FINDINGS:  X-Rays: Severe degenerative disc disease at L5-S1. Progression of the degenerative disc disease at L5-S1 from prior films obtained 07/26/2016  PATIENT SURVEYS:  02/08/22: FOTO 72 (predicted 72)  SCREENING FOR RED FLAGS: Bowel or bladder incontinence: No Spinal tumors: No  COGNITION: Overall cognitive status: Within functional limits for tasks assessed     SENSATION: 02/08/22: [facility]  MUSCLE LENGTH: 02/08/22 Hamstrings: tightness bil   POSTURE: rounded shoulders, forward head, and decreased lumbar lordosis  LUMBAR ROM:   AROM eval  Flexion Limited 25%  Extension Limited 25%  Right Quadrant WNL  Left Quadrant WNL   (Blank rows = not tested)  LOWER EXTREMITY ROM:   Lt hip tightness noted throughout, flexion and extension most impaired  Active  Left eval Left  02/21/22  Hip flexion Limited to ~ 80 deg ~ 85 deg  Hip extension Limited to ~ 0-5 deg   Hip abduction    Hip adduction    Hip internal rotation    Hip external rotation     (Blank rows = not tested)  LOWER EXTREMITY MMT:    MMT Right eval Left eval  Hip flexion 4/5 5/5  Hip extension    Hip abduction    Hip adduction    Hip internal rotation    Hip external  rotation    Knee flexion 5/5 5/5  Knee extension 5/5 5/5   (Blank  rows = not tested)  LUMBAR SPECIAL TESTS:  02/08/22: Slump test: Negative  GAIT: 02/08/22 Comments: Independent with amb  TODAY'S TREATMENT:                                                                                                                              DATE:  02/21/22 TherEx NuStep L6 x 10 min Reviewed HEP - see below, pt with good understanding of exercises Knee extension 10# 3x10 Knee flexion 20# 3x10 Squats 10# KB 2x10 Deadlifts 2x10 with 10# KB (mod cues needed; limited range) Showed other gym exercises provided in handout but not available in clinic - pt verbalized understanding  02/08/22 See HEP - demonstrated with trial reps performed with mod cues    PATIENT EDUCATION:  Education details: HEP Person educated: Patient Education method: Explanation, Demonstration, and Handouts Education comprehension: verbalized understanding, returned demonstration, and needs further education  HOME EXERCISE PROGRAM: Access Code: [email or web address] URL: [email or web address] Date: 02/21/2022 Prepared by: Faustino Congress  Exercises - Leg Swing Single Leg Balance  - 2 x daily - 7 x weekly - 1-2 sets - 10 reps - 3-5 sec hold - Standing Single Leg Hip ER   - 2 x daily - 7 x weekly - 1-2 sets - 10 reps - 3-5 hold - Hooklying Single Knee to Chest Stretch with Towel  - 2 x daily - 7 x weekly - 1 sets - 3 reps - 30 sec hold - Supine Hip Adductor Stretch  - 2 x daily - 7 x weekly - 1 sets - 5 reps - 15 sec hold - Supine Hamstring Stretch  - 2 x daily - 7 x weekly - 1 sets - 5 reps - 15 sec hold - Full Leg Press  - 1 x daily - 7 x weekly - 3 sets - 10 reps - Knee Extension with Weight Machine  - 1 x daily - 7 x weekly - 3 sets - 10 reps - Hamstring Curl with Weight Machine  - 1 x daily - 7 x weekly - 3 sets - 10 reps - Hip Abduction Machine  - 1 x daily - 7 x weekly - 3 sets - 10 reps - Hip Adduction Machine   - 1 x daily - 7 x weekly - 3 sets - 10 reps - Goblet Squat with Kettlebell  - 1 x daily - 7 x weekly - 3 sets - 10 reps - Kettlebell Deadlift  - 1 x daily - 7 x weekly - 3 sets - 10 reps  ASSESSMENT:  CLINICAL IMPRESSION: Pt needing min cues for initial HEP, but did provide gym program as well as his request.  Anticipate he will be ready for d/c next visit but will determine at that time if still appropriate.    OBJECTIVE IMPAIRMENTS: decreased ROM, decreased strength, hypomobility, increased fascial restrictions, increased muscle spasms, and pain.   ACTIVITY LIMITATIONS: standing, squatting, transfers, and locomotion level  PARTICIPATION LIMITATIONS: community  activity and yard work  PERSONAL FACTORS: PMH: OA, DM, HTN, Sleep apnea, hx bil THA (2016), ETOH abuse, cocaine dependence are also affecting patient's functional outcome.   REHAB POTENTIAL: Good  CLINICAL DECISION MAKING: Evolving/moderate complexity  EVALUATION COMPLEXITY: Moderate   GOALS: Goals reviewed with patient? Yes  SHORT TERM GOALS: Target date: 03/01/2022  Independent with initial HEP Goal status: Ongoing 02/21/22  LONG TERM GOALS: Target date: 03/22/2022  Independent with final HEP Goal status: INITIAL  2.  FOTO score maintained Goal status: INITIAL  3.  Lt hip flexion improved to 90 deg active for improved function and flexibility Goal status: INIITAL  4.  Report pain 50% improvement in symptoms for improved function Goal status: INITIAL   PLAN:  PT FREQUENCY: every other week  PT DURATION: 6 weeks  PLANNED INTERVENTIONS: Therapeutic exercises, Therapeutic activity, Neuromuscular re-education, Gait training, Patient/Family education, Self Care, Joint mobilization, Stair training, Aquatic Therapy, Dry Needling, Electrical stimulation, Cryotherapy, Moist heat, Taping, Ultrasound, Ionotophoresis 64m/ml Dexamethasone, Manual therapy, and Re-evaluation.  PLAN FOR NEXT SESSION: see how gym program is  going, measure hip flexion    SLaureen Abrahams PT, DPT 02/21/22 12:27 PM

## 2022-03-07 ENCOUNTER — Ambulatory Visit (INDEPENDENT_AMBULATORY_CARE_PROVIDER_SITE_OTHER): Payer: Medicare HMO | Admitting: Physical Therapy

## 2022-03-07 ENCOUNTER — Encounter: Payer: Self-pay | Admitting: Physical Therapy

## 2022-03-07 DIAGNOSIS — R29898 Other symptoms and signs involving the musculoskeletal system: Secondary | ICD-10-CM | POA: Diagnosis not present

## 2022-03-07 DIAGNOSIS — M5459 Other low back pain: Secondary | ICD-10-CM

## 2022-03-07 NOTE — Therapy (Signed)
OUTPATIENT PHYSICAL THERAPY TREATMENT NOTE DISCHARGE SUMMARY   Patient Name: Eric Wood MRN: [id number] DOB:December 09, 1955, 67 y.o., male Today's Date: 03/07/2022  END OF SESSION:   PT End of Session - 03/07/22 1148     Visit Number 3    Number of Visits 3    Date for PT Re-Evaluation 03/22/22    Authorization Type [employer] $10 copay    Progress Note Due on Visit 10    PT Start Time 1142    PT Stop Time 1158    PT Time Calculation (min) 16 min    Activity Tolerance Patient tolerated treatment well    Behavior During Therapy WFL for tasks assessed/performed              Past Medical History:  Diagnosis Date   Arthritis    Back pain    acupuncture   Diabetes mellitus    type 2   Family history of adverse reaction to anesthesia    Severe PONV   Hypercholesteremia    under control   Hypertension    Sleep apnea    sometimes wear CPAP    Past Surgical History:  Procedure Laterality Date   acupuncture     For back and hip   COLONOSCOPY W/ POLYPECTOMY     NO PAST SURGERIES     TOTAL HIP ARTHROPLASTY Right 06/11/2014   Procedure: RIGHT TOTAL HIP ARTHROPLASTY ANTERIOR APPROACH;  Surgeon: Mcarthur Rossetti, MD;  Location: [facility];  Service: Orthopedics;  Laterality: Right;   TOTAL HIP ARTHROPLASTY Left 08/20/2014   Procedure: LEFT TOTAL HIP ARTHROPLASTY ANTERIOR APPROACH;  Surgeon: Mcarthur Rossetti, MD;  Location: [facility];  Service: Orthopedics;  Laterality: Left;   Patient Active Problem List   Diagnosis Date Noted   Age-related nuclear cataract, bilateral 06/08/2020   Alcohol abuse 06/08/2020   Bilateral primary osteoarthritis of first carpometacarpal joints 06/08/2020   Bilateral primary osteoarthritis of knee 06/08/2020   Brachial neuritis 06/08/2020   Cataract 06/08/2020   Degeneration of lumbosacral intervertebral disc 06/08/2020   Tinea corporis [id number]   Dysmetabolic syndrome X [id number]   Eczema 06/08/2020   Encounter for screening for  other disorder 06/08/2020   Enthesopathy 06/08/2020   Benign essential hypertension 06/08/2020   H/O: depression 06/08/2020   Hypopotassemia 06/08/2020   Major depressive disorder, recurrent, unspecified ([place]) 06/08/2020   Nail dystrophy 06/08/2020   Neuropathy 06/08/2020   Obstructive sleep apnea 06/08/2020   Osteoarthritis of knee 06/08/2020   Pure hypercholesterolemia 06/08/2020   Other specified health status 06/08/2020   Other, mixed, or unspecified nondependent drug abuse, unspecified 06/08/2020   Pain in right wrist 06/08/2020   Recurrent major depressive episodes, moderate ([place]) 06/08/2020   Tachycardia 06/08/2020   Thoracic or lumbosacral neuritis or radiculitis 06/08/2020   Tinea versicolor 06/08/2020   Tubular adenoma 06/08/2020   Cocaine dependence ([place]) 02/22/2020   Constipation, unspecified 02/22/2020   Diabetes mellitus ([place]) 02/22/2020   Mood disorder ([place]) 09/08/2014   Osteoarthritis of left hip 08/20/2014   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   Osteoarthritis of right hip 06/11/2014   Status post total replacement of right hip 06/11/2014     THERAPY DIAG:  Other low back pain  Other symptoms and signs involving the musculoskeletal system   PCP: Dustin Folks, FNP  REFERRING PROVIDER: Pete Pelt, PA-C  REFERRING DIAG: [phone] (ICD-10-CM) - Status post total replacement of left hip M79.605 (ICD-10-CM) - Pain of left lower extremity  Rationale for  Evaluation and Treatment: Rehabilitation  EVAL THERAPY DIAG:  Other low back pain - Plan: PT plan of care cert/re-cert  Other symptoms and signs involving the musculoskeletal system - Plan: PT plan of care cert/re-cert  ONSET DATE: 3 months ago  SUBJECTIVE:                                                                                                                                                                                           SUBJECTIVE STATEMENT: Got a kettle bell and  doing dead lifts at home, hasn't been to the gym due to some allergies/congestion. Ready to graduate today.  PERTINENT HISTORY:  PMH: OA, DM, HTN, Sleep apnea, hx bil THA (2016), hx ETOH abuse, hx cocaine dependence  PAIN:  Are you having pain? Yes: NPRS scale: 0/10 Pain location: low back Pain description: dull Aggravating factors: lifting heavy stuff Relieving factors: sitting, rest  PRECAUTIONS: None  WEIGHT BEARING RESTRICTIONS: No  FALLS:  Has patient fallen in last 6 months? No  LIVING ENVIRONMENT: Lives with: two brothers Lives in: House/apartment Stairs: Yes: External: 3-4 steps; can reach both  OCCUPATION: Retired; Actor  PLOF: Independent and Leisure: goes to [employer] (1 mile walking), ab machine  PATIENT GOALS: improve stiffness  NEXT MD VISIT: 04/02/22  OBJECTIVE:   DIAGNOSTIC FINDINGS:  X-Rays: Severe degenerative disc disease at L5-S1. Progression of the degenerative disc disease at L5-S1 from prior films obtained 07/26/2016  PATIENT SURVEYS:  02/08/22: FOTO 72 (predicted 72)  SCREENING FOR RED FLAGS: Bowel or bladder incontinence: No Spinal tumors: No  COGNITION: Overall cognitive status: Within functional limits for tasks assessed     SENSATION: 02/08/22: [facility]  MUSCLE LENGTH: 02/08/22 Hamstrings: tightness bil   POSTURE: rounded shoulders, forward head, and decreased lumbar lordosis  LUMBAR ROM:   AROM eval 03/07/22  Flexion Limited 25% Limited 10%  Extension Limited 25% WNL  Right Quadrant WNL   Left Quadrant WNL    (Blank rows = not tested)  LOWER EXTREMITY ROM:   Lt hip tightness noted throughout, flexion and extension most impaired  Active  Left eval Left  02/21/22 Left 03/07/22  Hip flexion Limited to ~ 80 deg ~ 85 deg AA: 90 deg  Hip extension Limited to ~ 0-5 deg    Hip abduction     Hip adduction     Hip internal rotation     Hip external rotation      (Blank rows = not tested)  LOWER EXTREMITY MMT:    MMT  Right eval Left eval  Hip flexion 4/5 5/5  Hip extension    Hip abduction  Hip adduction    Hip internal rotation    Hip external rotation    Knee flexion 5/5 5/5  Knee extension 5/5 5/5   (Blank rows = not tested)  LUMBAR SPECIAL TESTS:  02/08/22: Slump test: Negative  GAIT: 02/08/22 Comments: Independent with amb  TODAY'S TREATMENT:                                                                                                                              DATE:  03/07/22 Discussed/reviewed HEP and answered questions PRN ROM measurements as noted above  02/21/22 TherEx NuStep L6 x 10 min Reviewed HEP - see below, pt with good understanding of exercises Knee extension 10# 3x10 Knee flexion 20# 3x10 Squats 10# KB 2x10 Deadlifts 2x10 with 10# KB (mod cues needed; limited range) Showed other gym exercises provided in handout but not available in clinic - pt verbalized understanding  02/08/22 See HEP - demonstrated with trial reps performed with mod cues    PATIENT EDUCATION:  Education details: HEP Person educated: Patient Education method: Explanation, Demonstration, and Handouts Education comprehension: verbalized understanding, returned demonstration, and needs further education  HOME EXERCISE PROGRAM: Access Code: [email or web address] URL: [email or web address] Date: 02/21/2022 Prepared by: Faustino Congress  Exercises - Leg Swing Single Leg Balance  - 2 x daily - 7 x weekly - 1-2 sets - 10 reps - 3-5 sec hold - Standing Single Leg Hip ER   - 2 x daily - 7 x weekly - 1-2 sets - 10 reps - 3-5 hold - Hooklying Single Knee to Chest Stretch with Towel  - 2 x daily - 7 x weekly - 1 sets - 3 reps - 30 sec hold - Supine Hip Adductor Stretch  - 2 x daily - 7 x weekly - 1 sets - 5 reps - 15 sec hold - Supine Hamstring Stretch  - 2 x daily - 7 x weekly - 1 sets - 5 reps - 15 sec hold - Full Leg Press  - 1 x daily - 7 x weekly - 3 sets - 10 reps - Knee Extension with  Weight Machine  - 1 x daily - 7 x weekly - 3 sets - 10 reps - Hamstring Curl with Weight Machine  - 1 x daily - 7 x weekly - 3 sets - 10 reps - Hip Abduction Machine  - 1 x daily - 7 x weekly - 3 sets - 10 reps - Hip Adduction Machine  - 1 x daily - 7 x weekly - 3 sets - 10 reps - Goblet Squat with Kettlebell  - 1 x daily - 7 x weekly - 3 sets - 10 reps - Kettlebell Deadlift  - 1 x daily - 7 x weekly - 3 sets - 10 reps  ASSESSMENT:  CLINICAL IMPRESSION: Pt has met/partially met all goals except FOTO.  He is pleased with his progress and ready to d/c from PT.  OBJECTIVE IMPAIRMENTS: decreased ROM, decreased strength, hypomobility, increased fascial restrictions, increased muscle spasms, and pain.   ACTIVITY LIMITATIONS: standing, squatting, transfers, and locomotion level  PARTICIPATION LIMITATIONS: community activity and yard work  PERSONAL FACTORS: PMH: OA, DM, HTN, Sleep apnea, hx bil THA (2016), ETOH abuse, cocaine dependence are also affecting patient's functional outcome.   REHAB POTENTIAL: Good  CLINICAL DECISION MAKING: Evolving/moderate complexity  EVALUATION COMPLEXITY: Moderate   GOALS: Goals reviewed with patient? Yes  SHORT TERM GOALS: Target date: 03/01/2022  Independent with initial HEP Goal status: MET 03/07/22  LONG TERM GOALS: Target date: 03/22/2022  Independent with final HEP Goal status: MET 03/07/22  2.  FOTO score maintained Goal status: NOT MET 03/07/22 (decreased 10% despite reports of doing better)  3.  Lt hip flexion improved to 90 deg active for improved function and flexibility Goal status: PARTIALLY MET 03/07/22  4.  Report pain 50% improvement in symptoms for improved function Goal status: MET 03/07/22   PLAN:  PT FREQUENCY: every other week  PT DURATION: 6 weeks  PLANNED INTERVENTIONS: Therapeutic exercises, Therapeutic activity, Neuromuscular re-education, Gait training, Patient/Family education, Self Care, Joint mobilization, Stair  training, Aquatic Therapy, Dry Needling, Electrical stimulation, Cryotherapy, Moist heat, Taping, Ultrasound, Ionotophoresis '4mg'$ /ml Dexamethasone, Manual therapy, and Re-evaluation.  PLAN FOR NEXT SESSION: d/c PT today    Laureen Abrahams, PT, DPT 03/07/22 12:03 PM      PHYSICAL THERAPY DISCHARGE SUMMARY  Visits from Start of Care: 3  Current functional level related to goals / functional outcomes: SEE ABOVE   Remaining deficits: See above   Education / Equipment: HEP   Patient agrees to discharge. Patient goals were partially met. Patient is being discharged due to being pleased with the current functional level.  Laureen Abrahams, PT, DPT 03/07/22 12:03 PM  [facility] [address] [place], [place], [address] Phone: [phone]   Fax:  [phone]

## 2022-04-02 ENCOUNTER — Encounter: Payer: Self-pay | Admitting: Orthopaedic Surgery

## 2022-04-02 ENCOUNTER — Ambulatory Visit (INDEPENDENT_AMBULATORY_CARE_PROVIDER_SITE_OTHER): Payer: Medicare HMO | Admitting: Orthopaedic Surgery

## 2022-04-02 DIAGNOSIS — M545 Low back pain, unspecified: Secondary | ICD-10-CM

## 2022-04-02 DIAGNOSIS — G8929 Other chronic pain: Secondary | ICD-10-CM | POA: Diagnosis not present

## 2022-04-02 NOTE — Progress Notes (Signed)
The patient comes in today after having formal physical therapy for his lumbar spine.  He is 67 years old and has a history of both his hips being replaced that we did in 2016.  He is very stiff overall he does state that therapy has helped.  He is someone who has a bathtub shower and is having hard time stepping over into the shower.  He is in need of a walk-in shower and I think this is medically necessary at this point given his stiffness and his fall risk.  He cannot step over a tub and I assessed him today to see if he could step up and over and have a good balance and coordination.  I am worried about him being a fall risk.  Both his hips are doing well from a range of motion standpoint in terms of rotation but he cannot flex well enough to step high in over shower.  From a back standpoint, he is doing well and can follow-up as needed.  He will continue a home back exercise program.  I did fill out prescription for him to get to the [place] stating that he is in need of a walk-in shower for safety purposes with his ADLs.

## 2022-04-25 ENCOUNTER — Ambulatory Visit: Payer: Medicare HMO | Admitting: Podiatry

## 2022-04-25 ENCOUNTER — Encounter: Payer: Self-pay | Admitting: Podiatry

## 2022-04-25 DIAGNOSIS — M79675 Pain in left toe(s): Secondary | ICD-10-CM | POA: Diagnosis not present

## 2022-04-25 DIAGNOSIS — B351 Tinea unguium: Secondary | ICD-10-CM

## 2022-04-25 DIAGNOSIS — E1142 Type 2 diabetes mellitus with diabetic polyneuropathy: Secondary | ICD-10-CM | POA: Diagnosis not present

## 2022-04-25 DIAGNOSIS — M79674 Pain in right toe(s): Secondary | ICD-10-CM | POA: Diagnosis not present

## 2022-04-25 NOTE — Progress Notes (Signed)
This patient returns to my office for at risk foot care.  This patient requires this care by a professional since this patient will be at risk due to having neuropathy and diabetes.  This patient is unable to cut nails himself since the patient cannot reach his nails.These nails are painful walking and wearing shoes.  This patient presents for at risk foot care today. ° °General Appearance  Alert, conversant and in no acute stress. ° °Vascular  Dorsalis pedis and posterior tibial  pulses are palpable  bilaterally.  Capillary return is within normal limits  bilaterally. Temperature is within normal limits  bilaterally. ° °Neurologic  Senn-Weinstein monofilament wire test within normal limits  bilaterally. Muscle power within normal limits bilaterally. ° °Nails Thick disfigured discolored nails with subungual debris  from hallux to fifth toes bilaterally. No evidence of bacterial infection or drainage bilaterally. ° °Orthopedic  No limitations of motion  feet .  No crepitus or effusions noted.  No bony pathology or digital deformities noted. ° °Skin  normotropic skin with no porokeratosis noted bilaterally.  No signs of infections or ulcers noted.    ° °Onychomycosis  Pain in right toes  Pain in left toes ° °Consent was obtained for treatment procedures.   Mechanical debridement of nails 1-5  bilaterally performed with a nail nipper.  Filed with dremel without incident.  ° ° °Return office visit 3 months                    Told patient to return for periodic foot care and evaluation due to potential at risk complications. ° ° °Deontray Hunnicutt DPM  °

## 2022-06-11 IMAGING — CR DG CHEST 2V
2 series · 2 of 2 positions shown · non-contrast
Comparison: Chest radiograph dated 03/12/2010

CLINICAL DATA: 64-year-old male with shortness of breath.

EXAM:
CHEST - 2 VIEW

[w chest lat]
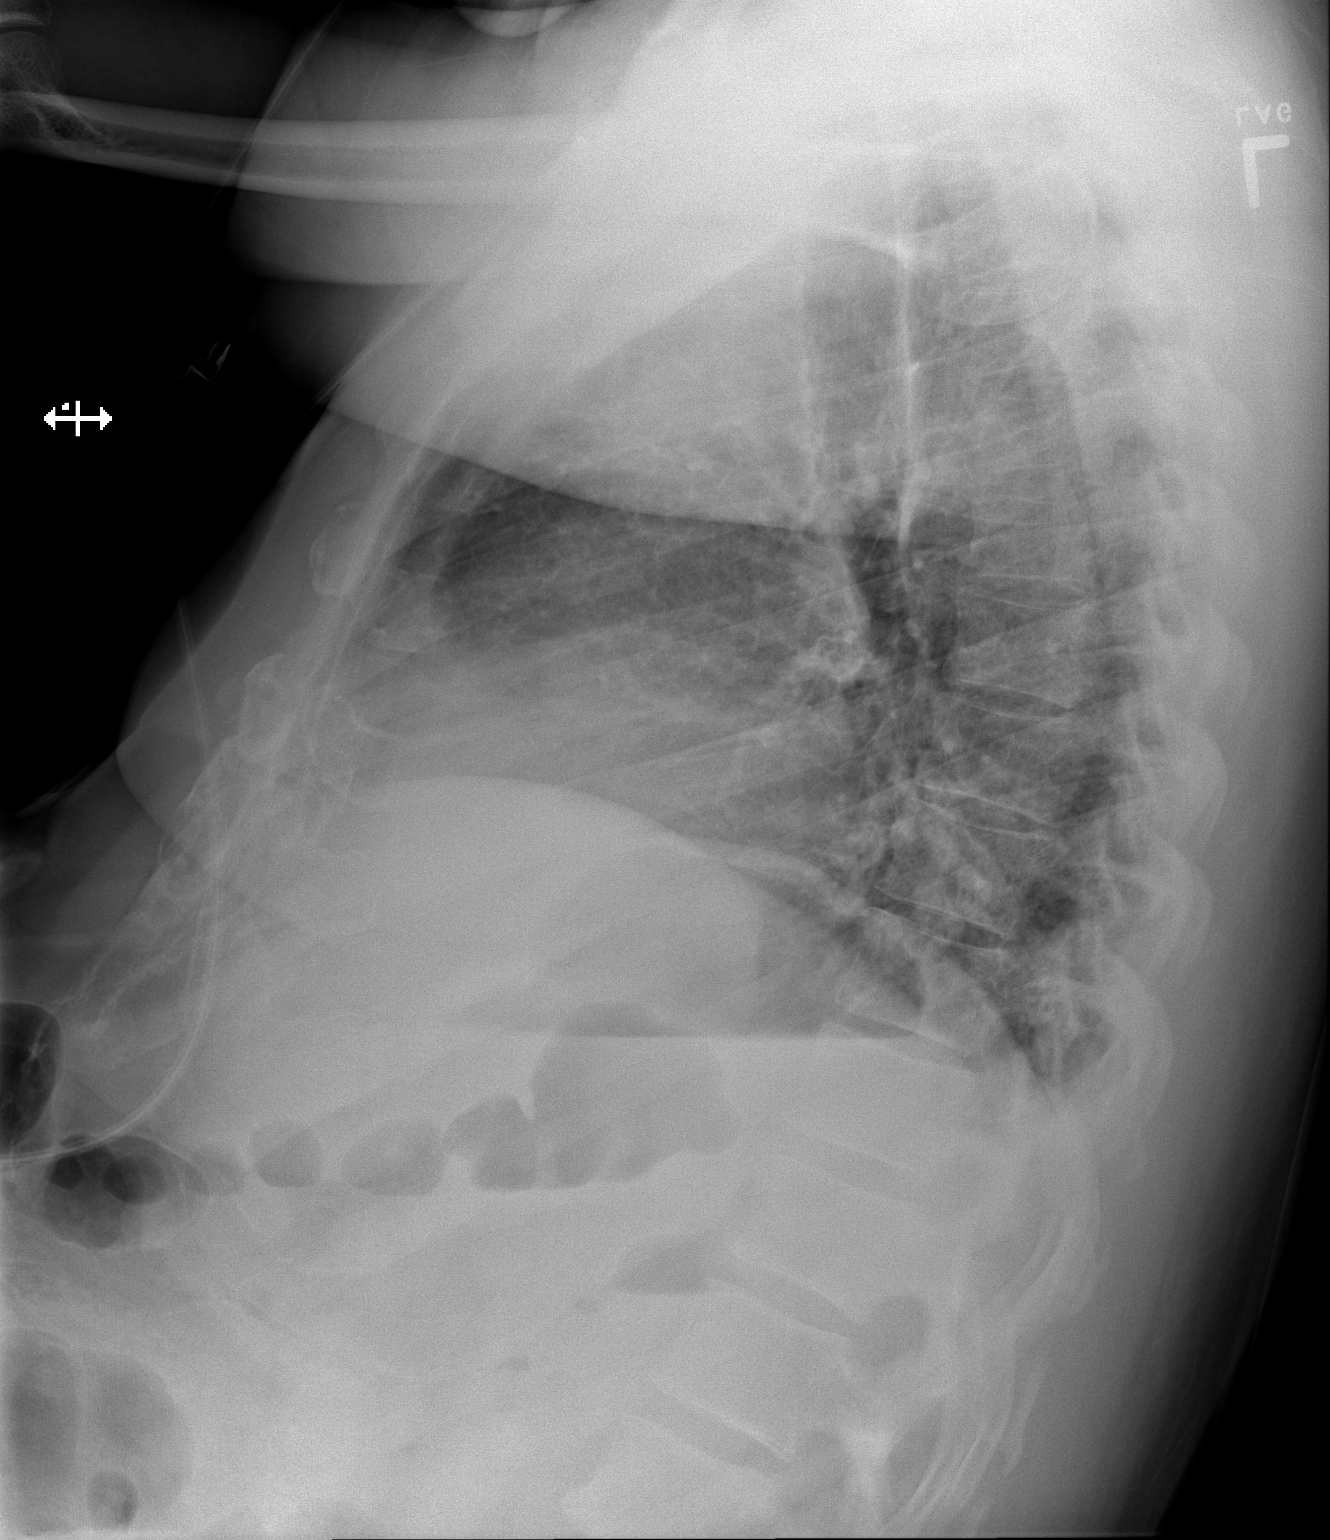

[x chest ap]
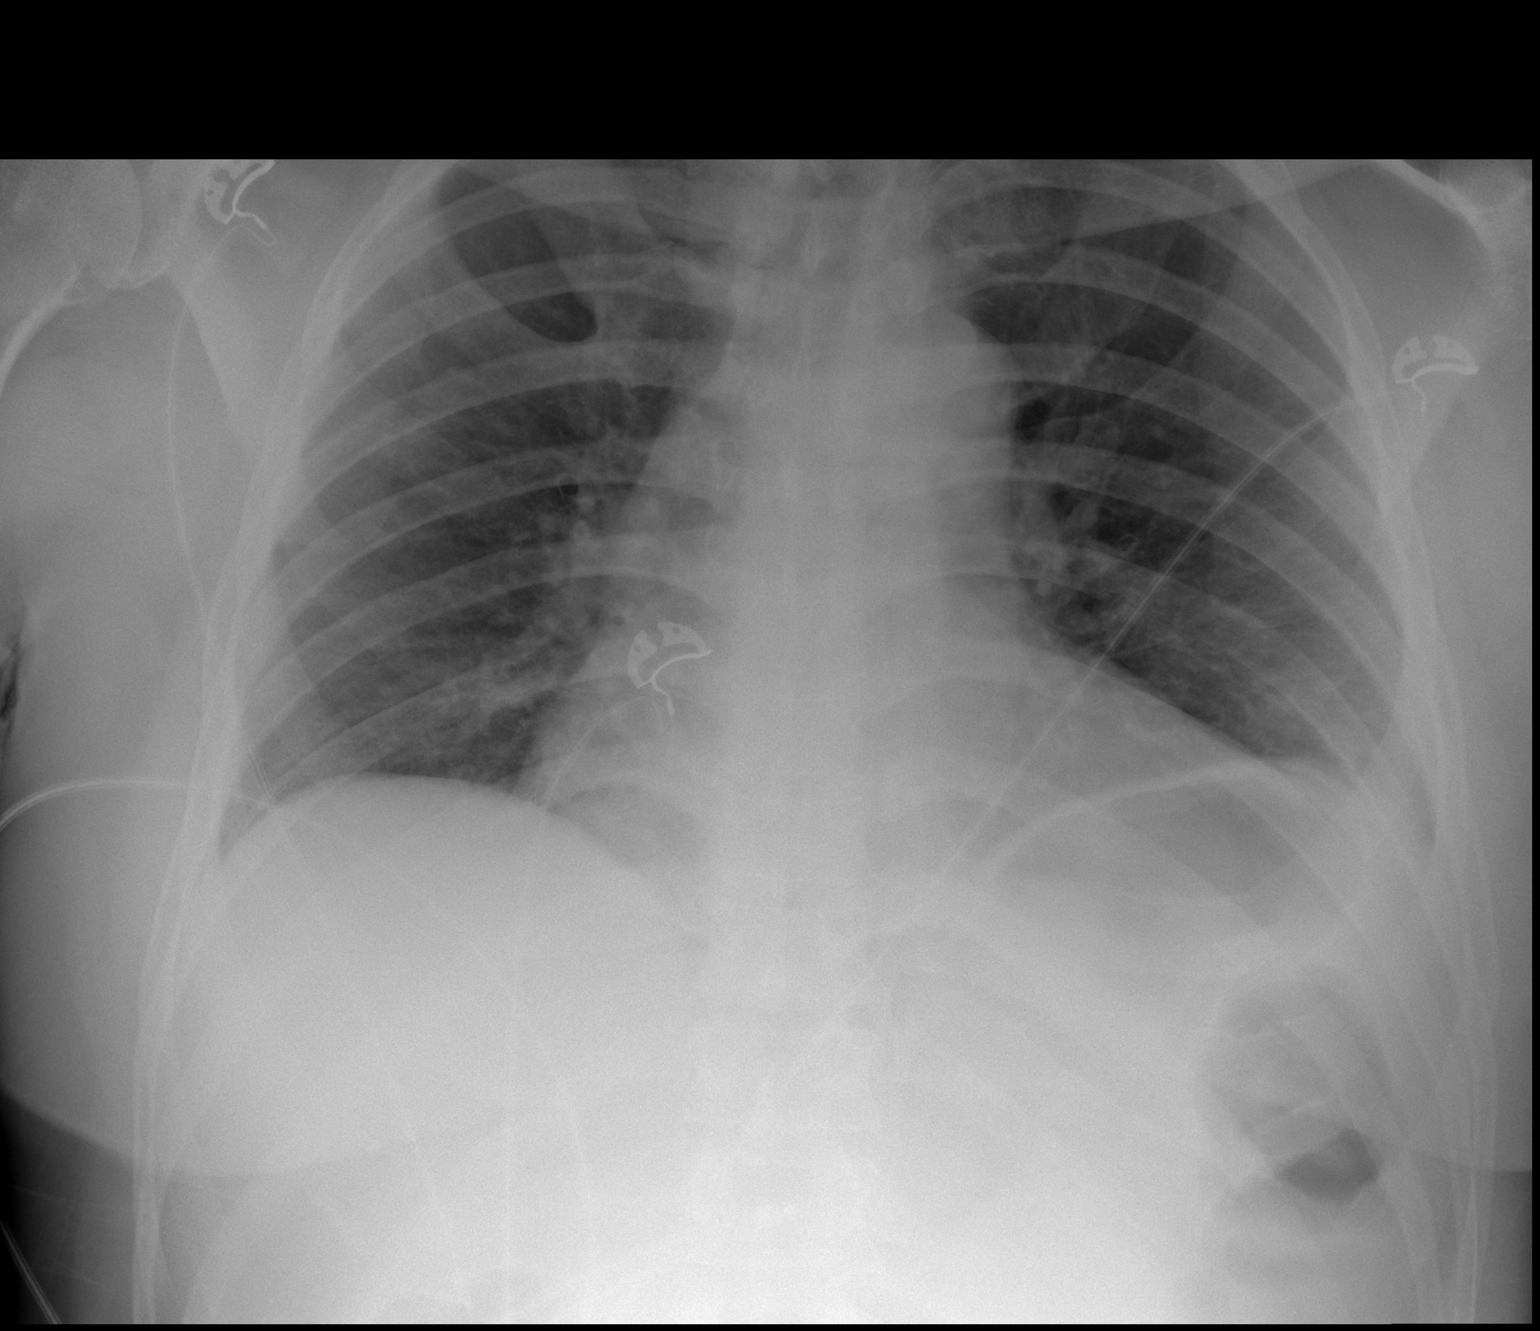

[2 of 2 positions shown; findings below may reference images not displayed]

FINDINGS: The shallow inspiration. There is mild diffuse interstitial
prominence which may represent atelectatic changes. Atypical
infection is less likely. Clinical correlation is recommended. No
focal consolidation, pleural effusion, or pneumothorax. Stable
cardiac silhouette. No acute osseous pathology.
IMPRESSION: No focal consolidation.

## 2022-07-30 ENCOUNTER — Ambulatory Visit (INDEPENDENT_AMBULATORY_CARE_PROVIDER_SITE_OTHER): Payer: Medicare HMO | Admitting: Podiatry

## 2022-07-30 DIAGNOSIS — M79674 Pain in right toe(s): Secondary | ICD-10-CM | POA: Diagnosis not present

## 2022-07-30 DIAGNOSIS — B351 Tinea unguium: Secondary | ICD-10-CM

## 2022-07-30 DIAGNOSIS — M79675 Pain in left toe(s): Secondary | ICD-10-CM | POA: Diagnosis not present

## 2022-07-30 NOTE — Progress Notes (Signed)
Nails x 10.  Former Hotel manager        Subjective:  Patient ID: Eric Wood, male    DOB: December 13, 1955,  MRN: [id number]   Eric Wood presents to clinic today for: No chief complaint on file. . Patient notes nails are thick, discolored, elongated and painful in shoegear when trying to ambulate.  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  PCP is Raymon Mutton., FNP.  Allergies  Allergen Reactions   Empagliflozin     Other reaction(s): Decreased renal function   Metformin Diarrhea    Review of Systems: Negative except as noted in the HPI.  Objective:  There were no vitals filed for this visit.  Eric Wood is a pleasant 67 y.o. male in NAD. AAO x 3.  Vascular Examination: Capillary refill time is 3-5 seconds to toes bilateral. Palpable pedal pulses b/l LE. Digital hair present b/l. No pedal edema b/l. Skin temperature gradient WNL b/l. No varicosities b/l. No cyanosis or clubbing noted b/l.   Dermatological Examination: Pedal skin with normal turgor, texture and tone b/l. No open wounds. No interdigital macerations b/l. Toenails x10 are 3mm thick, discolored, dystrophic with subungual debris. There is pain with compression of the nail plates.  They are elongated x10  Assessment/Plan: 1. Pain due to onychomycosis of toenails of both feet     The mycotic toenails were sharply debrided x10 with sterile nail nippers and a power debriding burr to decrease bulk/thickness and length.    Patient was thanked for his [employer] today.  Return in about 3 months (around 10/30/2022) for [facility].   Clerance Lav, DPM, FACFAS [facility]     [address] [place], [place] [address]                Office [phone]  Fax [phone]

## 2022-10-31 ENCOUNTER — Ambulatory Visit (INDEPENDENT_AMBULATORY_CARE_PROVIDER_SITE_OTHER): Payer: Medicare HMO | Admitting: Podiatry

## 2022-10-31 ENCOUNTER — Encounter: Payer: Self-pay | Admitting: Podiatry

## 2022-10-31 DIAGNOSIS — M79675 Pain in left toe(s): Secondary | ICD-10-CM | POA: Diagnosis not present

## 2022-10-31 DIAGNOSIS — E1142 Type 2 diabetes mellitus with diabetic polyneuropathy: Secondary | ICD-10-CM

## 2022-10-31 DIAGNOSIS — B351 Tinea unguium: Secondary | ICD-10-CM | POA: Diagnosis not present

## 2022-10-31 DIAGNOSIS — M79674 Pain in right toe(s): Secondary | ICD-10-CM

## 2022-10-31 NOTE — Progress Notes (Signed)
  Subjective:  Patient ID: Eric Wood, male    DOB: 17-Dec-1955,  MRN: [id number]  67 y.o. male presents preventative diabetic foot care and painful elongated mycotic toenails 1-5 bilaterally which are tender when wearing enclosed shoe gear. Pain is relieved with periodic professional debridement. Chief Complaint  Patient presents with   Diabetes    A1C-7.9 BS-179 PCPV-04/2022    New problem(s): None   PCP is Raymon Mutton., FNP .  Allergies  Allergen Reactions   Empagliflozin     Other reaction(s): Decreased renal function   Metformin Diarrhea    Review of Systems: Negative except as noted in the HPI.   Objective:  Eric Wood is a pleasant 67 y.o. male in NAD. AAO x 3.  Vascular Examination: Vascular status intact b/l with palpable pedal pulses. CFT immediate b/l. Pedal hair present. No edema. No pain with calf compression b/l. Skin temperature gradient WNL b/l. No varicosities noted. No cyanosis or clubbing noted.  Neurological Examination: Sensation grossly intact b/l with 10 gram monofilament. Vibratory sensation intact b/l. Pt has subjective symptoms of neuropathy.   Dermatological Examination: Pedal skin with normal turgor, texture and tone b/l. No open wounds nor interdigital macerations noted. Toenails 1-5 b/l thick, discolored, elongated with subungual debris and pain on dorsal palpation. No hyperkeratotic lesions noted b/l.   Musculoskeletal Examination: Muscle strength 5/5 to b/l LE.  No pain, crepitus noted b/l. No gross pedal deformities. Patient ambulates independently without assistive aids.   Radiographs: None  Last A1c:       No data to display         Assessment:   1. Pain due to onychomycosis of toenails of both feet   2. Diabetic peripheral neuropathy associated with type 2 diabetes mellitus (HCC)    Plan:  -Consent given for treatment as described below: -Examined patient. -Continue supportive shoe gear daily. -Toenails 1-5 bilaterally  were debrided in length and girth with sterile nail nippers and dremel. Pinpoint bleeding of R 3rd toe addressed with Lumicain Hemostatic Solution, cleansed with alcohol. triple antibiotic ointment applied. Patient/careigver instructed to apply triple antibiotic ointment once daily for 7 days. -Patient/POA to call should there be question/concern in the interim.  Return in about 3 months (around 01/31/2023).  Freddie Breech, DPM

## 2023-02-06 ENCOUNTER — Ambulatory Visit: Payer: Medicare HMO | Admitting: Podiatry

## 2023-02-11 ENCOUNTER — Encounter: Payer: Self-pay | Admitting: Podiatry

## 2023-02-11 ENCOUNTER — Ambulatory Visit (INDEPENDENT_AMBULATORY_CARE_PROVIDER_SITE_OTHER): Payer: Medicare Other | Admitting: Podiatry

## 2023-02-11 DIAGNOSIS — M79674 Pain in right toe(s): Secondary | ICD-10-CM

## 2023-02-11 DIAGNOSIS — M79675 Pain in left toe(s): Secondary | ICD-10-CM | POA: Diagnosis not present

## 2023-02-11 DIAGNOSIS — B351 Tinea unguium: Secondary | ICD-10-CM

## 2023-02-11 DIAGNOSIS — E1142 Type 2 diabetes mellitus with diabetic polyneuropathy: Secondary | ICD-10-CM | POA: Diagnosis not present

## 2023-02-11 DIAGNOSIS — Z794 Long term (current) use of insulin: Secondary | ICD-10-CM

## 2023-02-11 DIAGNOSIS — E119 Type 2 diabetes mellitus without complications: Secondary | ICD-10-CM | POA: Diagnosis not present

## 2023-02-11 NOTE — Progress Notes (Signed)
 This patient returns to my office for at risk foot care.  This patient requires this care by a professional since this patient will be at risk due to having neuropathy and diabetes.  This patient is unable to cut nails himself since the patient cannot reach his nails.These nails are painful walking and wearing shoes.  This patient presents for at risk foot care today.  General Appearance  Alert, conversant and in no acute stress.  Vascular  Dorsalis pedis and posterior tibial  pulses are palpable  bilaterally.  Capillary return is within normal limits  bilaterally. Temperature is within normal limits  bilaterally.  Neurologic  Senn-Weinstein monofilament wire test within normal limits  bilaterally. Muscle power within normal limits bilaterally.  Nails Thick disfigured discolored nails with subungual debris  from hallux to fifth toes bilaterally. No evidence of bacterial infection or drainage bilaterally.  Orthopedic  No limitations of motion  feet .  No crepitus or effusions noted.  No bony pathology or digital deformities noted. HAV  B/L.  Skin  normotropic skin with no porokeratosis noted bilaterally.  No signs of infections or ulcers noted.     Onychomycosis  Pain in right toes  Pain in left toes  Consent was obtained for treatment procedures.   Mechanical debridement of nails 1-5  bilaterally performed with a nail nipper.  Filed with dremel without incident.    Return office visit 3 months                    Told patient to return for periodic foot care and evaluation due to potential at risk complications.   Ruffin Cotton DPM

## 2023-05-13 ENCOUNTER — Ambulatory Visit: Payer: Medicare Other | Admitting: Podiatry

## 2023-05-13 ENCOUNTER — Encounter: Payer: Self-pay | Admitting: Podiatry

## 2023-05-13 DIAGNOSIS — M79674 Pain in right toe(s): Secondary | ICD-10-CM

## 2023-05-13 DIAGNOSIS — B351 Tinea unguium: Secondary | ICD-10-CM

## 2023-05-13 DIAGNOSIS — M79675 Pain in left toe(s): Secondary | ICD-10-CM | POA: Diagnosis not present

## 2023-05-13 DIAGNOSIS — E1142 Type 2 diabetes mellitus with diabetic polyneuropathy: Secondary | ICD-10-CM

## 2023-05-13 NOTE — Progress Notes (Signed)
 This patient returns to my office for at risk foot care.  This patient requires this care by a professional since this patient will be at risk due to having neuropathy and diabetes.  This patient is unable to cut nails himself since the patient cannot reach his nails.These nails are painful walking and wearing shoes.  This patient presents for at risk foot care today.  General Appearance  Alert, conversant and in no acute stress.  Vascular  Dorsalis pedis and posterior tibial  pulses are palpable  bilaterally.  Capillary return is within normal limits  bilaterally. Temperature is within normal limits  bilaterally.  Neurologic  Senn-Weinstein monofilament wire test within normal limits  bilaterally. Muscle power within normal limits bilaterally.  Nails Thick disfigured discolored nails with subungual debris  from hallux to fifth toes bilaterally. No evidence of bacterial infection or drainage bilaterally.  Orthopedic  No limitations of motion  feet .  No crepitus or effusions noted.  No bony pathology or digital deformities noted. HAV  B/L.  Skin  normotropic skin with no porokeratosis noted bilaterally.  No signs of infections or ulcers noted.     Onychomycosis  Pain in right toes  Pain in left toes  Consent was obtained for treatment procedures.   Mechanical debridement of nails 1-5  bilaterally performed with a nail nipper.  Filed with dremel without incident.    Return office visit 3 months                    Told patient to return for periodic foot care and evaluation due to potential at risk complications.   Ruffin Cotton DPM

## 2023-08-13 ENCOUNTER — Encounter: Payer: Self-pay | Admitting: Podiatry

## 2023-08-13 ENCOUNTER — Ambulatory Visit: Payer: Medicare Other | Admitting: Podiatry

## 2023-08-13 DIAGNOSIS — E1142 Type 2 diabetes mellitus with diabetic polyneuropathy: Secondary | ICD-10-CM

## 2023-08-13 DIAGNOSIS — E119 Type 2 diabetes mellitus without complications: Secondary | ICD-10-CM

## 2023-08-13 DIAGNOSIS — B351 Tinea unguium: Secondary | ICD-10-CM | POA: Diagnosis not present

## 2023-08-13 DIAGNOSIS — M79674 Pain in right toe(s): Secondary | ICD-10-CM | POA: Diagnosis not present

## 2023-08-13 DIAGNOSIS — M79675 Pain in left toe(s): Secondary | ICD-10-CM

## 2023-08-18 ENCOUNTER — Encounter: Payer: Self-pay | Admitting: Podiatry

## 2023-08-18 NOTE — Progress Notes (Signed)
 ANNUAL DIABETIC FOOT EXAM  Subjective: Eric Wood presents today for annual diabetic foot exam. Chief Complaint  Patient presents with   RFC    Rm15 Diabetic foot care /Dr. Prentice Sharps last visit June 2025/A1C 8.2    Patient confirms h/o diabetes.  Patient denies any h/o foot wounds.  Patient has been diagnosed with neuropathy.  Sharps Prentice DELENA Mickey., FNP is patient's PCP.  Past Medical History:  Diagnosis Date   Arthritis    Back pain    acupuncture   Diabetes mellitus    type 2   Family history of adverse reaction to anesthesia    Severe PONV   Hypercholesteremia    under control   Hypertension    Sleep apnea    sometimes wear CPAP    Patient Active Problem List   Diagnosis Date Noted   Age-related nuclear cataract, bilateral 06/08/2020   Alcohol abuse 06/08/2020   Bilateral primary osteoarthritis of first carpometacarpal joints 06/08/2020   Bilateral primary osteoarthritis of knee 06/08/2020   Brachial neuritis 06/08/2020   Cataract 06/08/2020   Degeneration of lumbosacral intervertebral disc 06/08/2020   Tinea corporis 06/08/2020   Dysmetabolic syndrome X 06/08/2020   Eczema 06/08/2020   Encounter for screening for other disorder 06/08/2020   Enthesopathy 06/08/2020   Benign essential hypertension 06/08/2020   H/O: depression 06/08/2020   Hypopotassemia 06/08/2020   Major depressive disorder, recurrent, unspecified (HCC) 06/08/2020   Nail dystrophy 06/08/2020   Neuropathy 06/08/2020   Obstructive sleep apnea 06/08/2020   Osteoarthritis of knee 06/08/2020   Pure hypercholesterolemia 06/08/2020   Other specified health status 06/08/2020   Other, mixed, or unspecified nondependent drug abuse, unspecified 06/08/2020   Pain in right wrist 06/08/2020   Recurrent major depressive episodes, moderate (HCC) 06/08/2020   Tachycardia 06/08/2020   Thoracic or lumbosacral neuritis or radiculitis 06/08/2020   Tinea versicolor 06/08/2020   Tubular adenoma 06/08/2020    Cocaine dependence (HCC) 02/22/2020   Constipation, unspecified 02/22/2020   Diabetes mellitus (HCC) 02/22/2020   Mood disorder (HCC) 09/08/2014   Osteoarthritis of left hip 08/20/2014   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   Osteoarthritis of right hip 06/11/2014   Status post total replacement of right hip 06/11/2014   Past Surgical History:  Procedure Laterality Date   acupuncture     For back and hip   COLONOSCOPY W/ POLYPECTOMY     NO PAST SURGERIES     TOTAL HIP ARTHROPLASTY Right 06/11/2014   Procedure: RIGHT TOTAL HIP ARTHROPLASTY ANTERIOR APPROACH;  Surgeon: Lonni CINDERELLA Poli, MD;  Location: [facility];  Service: Orthopedics;  Laterality: Right;   TOTAL HIP ARTHROPLASTY Left 08/20/2014   Procedure: LEFT TOTAL HIP ARTHROPLASTY ANTERIOR APPROACH;  Surgeon: Lonni CINDERELLA Poli, MD;  Location: [facility];  Service: Orthopedics;  Laterality: Left;   Current Outpatient Medications on File Prior to Visit  Medication Sig Dispense Refill   acetaminophen  (TYLENOL ) 325 MG tablet Take 325 mg by mouth every 6 (six) hours as needed for moderate pain.     aspirin  EC 325 MG EC tablet Take 1 tablet (325 mg total) by mouth 2 (two) times daily after a meal. 30 tablet 0   atenolol  (TENORMIN ) 25 MG tablet Take 25 mg by mouth every morning.      benzoyl peroxide 10 % gel Apply topically.     clotrimazole  (LOTRIMIN ) 1 % cream Apply to both feet and between toes twice daily for 4 weeks. 30 g 1  dextrose  (GLUTOSE) 40 % GEL Take by mouth.     gabapentin  (NEURONTIN ) 300 MG capsule Take 300 mg by mouth 2 (two) times daily.     glipiZIDE  (GLUCOTROL ) 10 MG tablet Take 10 mg by mouth 2 (two) times daily before a meal.     hydrochlorothiazide (HYDRODIURIL) 25 MG tablet TAKE 1 TABLET BY MOUTH ONCE DAILY FOR HIGH BLOOD PRESSURE FOR 90 DAYS     hydrocortisone 2.5 % cream Apply topically.     insulin  aspart (NOVOLOG ) 100 UNIT/ML injection Inject into the skin.     insulin  glargine (LANTUS)  100 UNIT/ML injection Inject into the skin.     ketoconazole (NIZORAL) 2 % shampoo Apply topically.     lisinopril  (PRINIVIL ,ZESTRIL ) 10 MG tablet Take 20 mg by mouth every morning.      lisinopril  (ZESTRIL ) 40 MG tablet TAKE 1 TABLET BY MOUTH ONCE DAILY FOR HIGH BLOOD PRESSURE FOR 90 DAYS     lisinopril -hydrochlorothiazide (ZESTORETIC) 20-12.5 MG tablet Take by mouth.     loratadine (CLARITIN) 10 MG tablet Take 1 tablet by mouth daily.     meloxicam  (MOBIC ) 15 MG tablet Take 1 tablet (15 mg total) by mouth daily. 30 tablet 0   metFORMIN  (GLUCOPHAGE ) 500 MG tablet Take 500 mg by mouth 2 (two) times daily with a meal.     miconazole (ZEASORB-AF) 2 % powder Apply topically as needed for itching. 85 g 2   PRESCRIPTION MEDICATION Apply 1 application topically 2 (two) times daily as needed (dry skin). Lotion he receives from the [place]     selenium sulfide (SELSUN) 2.5 % shampoo Apply topically.     Semaglutide (OZEMPIC, 2 MG/DOSE, [place]) Inject 2 mg into the skin once a week.     senna (SENOKOT) 8.6 MG TABS tablet Take 1 tablet by mouth daily as needed for mild constipation.     sertraline  (ZOLOFT ) 25 MG tablet Take 25 mg by mouth daily.     sildenafil (VIAGRA) 100 MG tablet Take by mouth.     simvastatin  (ZOCOR ) 10 MG tablet Take 10 mg by mouth at bedtime.     simvastatin  (ZOCOR ) 40 MG tablet TAKE 1 TABLET BY MOUTH ONCE DAILY IN THE EVENING FOR 90 DAYS     terbinafine (LAMISIL) 1 % cream Apply topically.     tiZANidine  (ZANAFLEX ) 4 MG tablet TAKE 1 TABLET (4 MG TOTAL) BY MOUTH EVERY 8 (EIGHT) HOURS AS NEEDED FOR MUSCLE SPASMS. 60 tablet 0   UNABLE TO FIND C Pap     No current facility-administered medications on file prior to visit.    Allergies  Allergen Reactions   Empagliflozin     Other reaction(s): Decreased renal function   Metformin  Diarrhea   Social History   Occupational History   Not on file  Tobacco Use   Smoking status: Never   Smokeless tobacco: Never  Substance and Sexual  Activity   Alcohol use: Yes    Comment: occ beer    Drug use: No    Types: Crack cocaine    Comment: last time 1-2 years ago   Sexual activity: Not Currently   History reviewed. No pertinent family history. Immunization History  Administered Date(s) Administered   Influenza, High Dose Seasonal PF 11/13/2015   Influenza, Seasonal, Injecte, Preservative Fre 11/29/2009, 11/19/2012, 03/09/2015   Influenza,inj,Quad PF,6+ Mos 12/07/2015, 10/04/2016, 10/24/2017   Influenza-Unspecified 10/01/2001, 11/29/2003, 10/21/2004, 10/29/2005, 11/20/2006, 09/09/2007, 10/26/2008, 11/01/2008, 09/26/2010, 09/11/2011, 10/04/2016, 11/10/2018, 09/11/2019   Moderna Covid-19 Fall Seasonal Vaccine 77yrs &  older 10/02/2022   PFIZER Comirnaty(Gray Top)Covid-19 Tri-Sucrose Vaccine 04/13/2020   PFIZER(Purple Top)SARS-COV-2 Vaccination 02/12/2019, 03/09/2019, 12/21/2019   Pfizer Covid-19 Vaccine Bivalent Booster 49yrs & up 08/10/2021   Pfizer(Comirnaty)Fall Seasonal Vaccine 12 years and older 12/11/2021   Pneumococcal Polysaccharide-23 11/29/2003, 10/02/2006, 05/24/2015, 10/27/2019   Tdap 03/06/2011   Zoster Recombinant(Shingrix) 10/27/2019, 02/18/2020   Zoster, Live 12/07/2015     Review of Systems: Negative except as noted in the HPI.   Objective: There were no vitals filed for this visit.  Emarion Toral is a pleasant 68 y.o. male in NAD. AAO X 3.  Diabetic foot exam was performed with the following findings:   No deformities, ulcerations, or other skin breakdown Normal sensation of 10g monofilament Intact posterior tibialis and dorsalis pedis pulses Vascular Examination: Capillary refill time immediate b/l. Vascular status intact b/l with palpable pedal pulses. Pedal hair present b/l. No pain with calf compression b/l. Skin temperature gradient WNL b/l. No cyanosis or clubbing b/l. No ischemia or gangrene noted b/l.   Neurological Examination: Sensation grossly intact b/l with 10 gram monofilament.  Vibratory sensation intact b/l. Pt has subjective symptoms of neuropathy.  Dermatological Examination: Pedal skin with normal turgor, texture and tone b/l.  No open wounds. No interdigital macerations.   Toenails 1-5 b/l thick, discolored, elongated with subungual debris and pain on dorsal palpation.   No hyperkeratotic nor porokeratotic lesions.  Musculoskeletal Examination: Muscle strength 5/5 to all lower extremity muscle groups bilaterally. No pain, crepitus or joint limitation noted with ROM b/l LE. No gross bony pedal deformities b/l. Patient ambulates independently without assistive aids.  Radiographs: None     ADA Risk Categorization: Low Risk :  Patient has all of the following: Intact protective sensation No prior foot ulcer  No severe deformity Pedal pulses present  Assessment: 1. Pain due to onychomycosis of toenails of both feet   2. Encounter for diabetic foot exam (HCC)   3. Diabetic peripheral neuropathy associated with type 2 diabetes mellitus (HCC)     Plan: Diabetic foot examination performed today. All patient's and/or POA's questions/concerns addressed on today's visit. Toenails 1-5 debrided in length and girth without incident. Continue foot and shoe inspections daily. Monitor blood glucose per PCP/Endocrinologist's recommendations. Continue soft, supportive shoe gear daily. Report any pedal injuries to medical professional. Call office if there are any questions/concerns. -Patient/POA to call should there be question/concern in the interim. Return in about 3 months (around 11/13/2023).  Delon LITTIE Merlin, DPM      [place] LOCATION: [address], [place] [address]                   Office [phone]   [facility] LOCATION: [address] [place], [place] [address] Office [phone]

## 2023-08-20 ENCOUNTER — Ambulatory Visit: Admitting: Podiatry

## 2023-11-12 ENCOUNTER — Encounter: Payer: Self-pay | Admitting: Podiatry

## 2023-11-12 ENCOUNTER — Ambulatory Visit: Payer: Medicare Other | Admitting: Podiatry

## 2023-11-12 DIAGNOSIS — E1142 Type 2 diabetes mellitus with diabetic polyneuropathy: Secondary | ICD-10-CM

## 2023-11-12 DIAGNOSIS — L853 Xerosis cutis: Secondary | ICD-10-CM

## 2023-11-12 DIAGNOSIS — B351 Tinea unguium: Secondary | ICD-10-CM

## 2023-11-12 DIAGNOSIS — M79675 Pain in left toe(s): Secondary | ICD-10-CM

## 2023-11-12 DIAGNOSIS — M79674 Pain in right toe(s): Secondary | ICD-10-CM | POA: Diagnosis not present

## 2023-11-14 ENCOUNTER — Encounter (HOSPITAL_COMMUNITY): Payer: Self-pay

## 2023-11-14 ENCOUNTER — Other Ambulatory Visit: Payer: Self-pay

## 2023-11-14 ENCOUNTER — Emergency Department (HOSPITAL_COMMUNITY)
Admission: EM | Admit: 2023-11-14 | Discharge: 2023-11-14 | Disposition: A | Discharge status: Home / Self Care | Attending: Emergency Medicine | Admitting: Emergency Medicine

## 2023-11-14 ENCOUNTER — Emergency Department (HOSPITAL_COMMUNITY)

## 2023-11-14 DIAGNOSIS — Z7982 Long term (current) use of aspirin: Secondary | ICD-10-CM | POA: Insufficient documentation

## 2023-11-14 DIAGNOSIS — I10 Essential (primary) hypertension: Secondary | ICD-10-CM | POA: Diagnosis not present

## 2023-11-14 DIAGNOSIS — R29810 Facial weakness: Secondary | ICD-10-CM | POA: Diagnosis present

## 2023-11-14 DIAGNOSIS — Z794 Long term (current) use of insulin: Secondary | ICD-10-CM | POA: Insufficient documentation

## 2023-11-14 DIAGNOSIS — E119 Type 2 diabetes mellitus without complications: Secondary | ICD-10-CM | POA: Diagnosis not present

## 2023-11-14 DIAGNOSIS — G510 Bell's palsy: Secondary | ICD-10-CM

## 2023-11-14 LAB — CBC WITH DIFFERENTIAL/PLATELET
Abs Immature Granulocytes: 0 K/uL (ref 0.00–0.07)
Basophils Absolute: 0 K/uL (ref 0.0–0.1)
Basophils Relative: 1 %
Eosinophils Absolute: 0.2 K/uL (ref 0.0–0.5)
Eosinophils Relative: 4 %
HCT: 37.2 % — ABNORMAL LOW (ref 39.0–52.0)
Hemoglobin: 12.4 g/dL — ABNORMAL LOW (ref 13.0–17.0)
Immature Granulocytes: 0 %
Lymphocytes Relative: 44 %
Lymphs Abs: 1.9 K/uL (ref 0.7–4.0)
MCH: 29.5 pg (ref 26.0–34.0)
MCHC: 33.3 g/dL (ref 30.0–36.0)
MCV: 88.6 fL (ref 80.0–100.0)
Monocytes Absolute: 0.3 K/uL (ref 0.1–1.0)
Monocytes Relative: 8 %
Neutro Abs: 1.8 K/uL (ref 1.7–7.7)
Neutrophils Relative %: 43 %
Platelets: 284 K/uL (ref 150–400)
RBC: 4.2 MIL/uL — ABNORMAL LOW (ref 4.22–5.81)
RDW: 13.3 % (ref 11.5–15.5)
WBC: 4.2 K/uL (ref 4.0–10.5)
nRBC: 0 % (ref 0.0–0.2)

## 2023-11-14 LAB — BASIC METABOLIC PANEL WITH GFR
Anion gap: 11 (ref 5–15)
BUN: 10 mg/dL (ref 8–23)
CO2: 24 mmol/L (ref 22–32)
Calcium: 8.7 mg/dL — ABNORMAL LOW (ref 8.9–10.3)
Chloride: 105 mmol/L (ref 98–111)
Creatinine, Ser: 1.16 mg/dL (ref 0.61–1.24)
GFR, Estimated: >60 mL/min (ref >60)
Glucose, Bld: 182 mg/dL — ABNORMAL HIGH (ref 70–99)
Potassium: 3.7 mmol/L (ref 3.5–5.1)
Sodium: 140 mmol/L (ref 135–145)

## 2023-11-14 MED ORDER — METHYLPREDNISOLONE 4 MG PO TBPK: ORAL_TABLET | ORAL | 0 refills | Status: DC

## 2023-11-14 MED ORDER — VALACYCLOVIR HCL 1 G PO TABS
1000.0000 mg | ORAL_TABLET | Freq: Three times a day (TID) | ORAL | 0 refills | Status: AC
Start: 2023-11-14 — End: ?

## 2023-11-14 NOTE — ED Provider Notes (Signed)
 [place] EMERGENCY DEPARTMENT AT [facility] Provider Note   CSN: [id number] Arrival date & time: 11/14/23  1331     Patient presents with: Facial Droop   Eric Wood is a 68 y.o. male hx of HTN, DM, who presented with right facial droop.  Patient states that since yesterday, he noticed that right face appears abnormal.  He states that today he was rinsing mouthwash and it came out the right side.  He states that he has a hard time speaking since he cannot move his tongue right.  Patient denies any history of stroke.  He states that he had Bell's palsy previously with similar symptoms.  Denies any dryness in his eye.  He does complain of some pain in the right ear   The history is provided by the patient.       Prior to Admission medications   Medication Sig Start Date End Date Taking? Authorizing Provider  acetaminophen  (TYLENOL ) 325 MG tablet Take 325 mg by mouth every 6 (six) hours as needed for moderate pain.    [provider]  aspirin  EC 325 MG EC tablet Take 1 tablet (325 mg total) by mouth 2 (two) times daily after a meal. 06/12/14   Vernetta Lonni GRADE, MD  atenolol  (TENORMIN ) 25 MG tablet Take 25 mg by mouth every morning.     [provider]  benzoyl peroxide 10 % gel Apply topically. 05/17/16   [provider]  clotrimazole  (LOTRIMIN ) 1 % cream Apply to both feet and between toes twice daily for 4 weeks. 08/11/18   Gaynel Delon CROME, DPM  dextrose  (GLUTOSE) 40 % GEL Take by mouth. 06/24/19   [provider]  gabapentin  (NEURONTIN ) 300 MG capsule Take 300 mg by mouth 2 (two) times daily.    [provider]  glipiZIDE  (GLUCOTROL ) 10 MG tablet Take 10 mg by mouth 2 (two) times daily before a meal.    [provider]  hydrochlorothiazide (HYDRODIURIL) 25 MG tablet TAKE 1 TABLET BY MOUTH ONCE DAILY FOR HIGH BLOOD PRESSURE FOR 90 DAYS 06/30/18   [provider]  hydrocortisone 2.5 % cream Apply  topically. 05/17/16   [provider]  insulin  aspart (NOVOLOG ) 100 UNIT/ML injection Inject into the skin. 10/31/19   [provider]  insulin  glargine (LANTUS) 100 UNIT/ML injection Inject into the skin.    [provider]  ketoconazole (NIZORAL) 2 % shampoo Apply topically. 03/08/20   [provider]  lisinopril  (PRINIVIL ,ZESTRIL ) 10 MG tablet Take 20 mg by mouth every morning.     [provider]  lisinopril  (ZESTRIL ) 40 MG tablet TAKE 1 TABLET BY MOUTH ONCE DAILY FOR HIGH BLOOD PRESSURE FOR 90 DAYS 06/30/18   [provider]  lisinopril -hydrochlorothiazide (ZESTORETIC) 20-12.5 MG tablet Take by mouth. 10/31/19   [provider]  loratadine (CLARITIN) 10 MG tablet Take 1 tablet by mouth daily. 10/31/19   [provider]  meloxicam  (MOBIC ) 15 MG tablet Take 1 tablet (15 mg total) by mouth daily. 07/26/16   Vernetta Lonni GRADE, MD  metFORMIN  (GLUCOPHAGE ) 500 MG tablet Take 500 mg by mouth 2 (two) times daily with a meal.    [provider]  miconazole (ZEASORB-AF) 2 % powder Apply topically as needed for itching. 07/05/21   Gaynel Delon CROME, DPM  PRESCRIPTION MEDICATION Apply 1 application topically 2 (two) times daily as needed (dry skin). Lotion he receives from the [employer], Historical, MD  selenium sulfide (SELSUN) 2.5 %  shampoo Apply topically. 10/27/19   [provider]  Semaglutide (OZEMPIC, 2 MG/DOSE, [place]) Inject 2 mg into the skin once a week.    [provider]  senna (SENOKOT) 8.6 MG TABS tablet Take 1 tablet by mouth daily as needed for mild constipation.    [provider]  sertraline  (ZOLOFT ) 25 MG tablet Take 25 mg by mouth daily.    [provider]  sildenafil (VIAGRA) 100 MG tablet Take by mouth. 10/31/19   [provider]  simvastatin  (ZOCOR ) 10 MG tablet Take 10 mg by mouth at bedtime.    [provider]  simvastatin  (ZOCOR ) 40 MG tablet  TAKE 1 TABLET BY MOUTH ONCE DAILY IN THE EVENING FOR 90 DAYS 06/30/18   [provider]  terbinafine (LAMISIL) 1 % cream Apply topically. 05/18/20   [provider]  tiZANidine  (ZANAFLEX ) 4 MG tablet TAKE 1 TABLET (4 MG TOTAL) BY MOUTH EVERY 8 (EIGHT) HOURS AS NEEDED FOR MUSCLE SPASMS. 08/17/16   Vernetta Lonni GRADE, MD  UNABLE TO FIND C Pap    [provider]    Allergies: Empagliflozin and Metformin     Review of Systems  HENT:         Right facial droop  All other systems reviewed and are negative.   Updated Vital Signs BP 133/89 (BP Location: Left Arm)   Pulse 76   Temp 98 F (36.7 C) (Oral)   Resp 16   SpO2 99%   Physical Exam Vitals and nursing note reviewed.  HENT:     Head: Normocephalic.     Ears:     Comments: No obvious lesions in the right ear    Nose: Nose normal.     Mouth/Throat:     Mouth: Mucous membranes are moist.  Eyes:     Extraocular Movements: Extraocular movements intact.     Pupils: Pupils are equal, round, and reactive to light.  Cardiovascular:     Rate and Rhythm: Normal rate.     Pulses: Normal pulses.     Heart sounds: Normal heart sounds.  Pulmonary:     Effort: Pulmonary effort is normal.     Breath sounds: Normal breath sounds.  Abdominal:     General: Abdomen is flat.  Musculoskeletal:        General: Normal range of motion.     Cervical back: Normal range of motion and neck supple.  Skin:    General: Skin is warm.  Neurological:     Mental Status: He is alert.     Comments: Obvious right facial droop.  Patient also has flattening of right nasolabial fold.  Patient is able to close the right eye.  Normal strength and sensation bilateral arms and leg  Psychiatric:        Mood and Affect: Mood normal.     (all labs ordered are listed, but only abnormal results are displayed) Labs Reviewed  CBC WITH DIFFERENTIAL/PLATELET - Abnormal; Notable for the following components:      Result Value   RBC 4.20  (*)    Hemoglobin 12.4 (*)    HCT 37.2 (*)    All other components within normal limits  BASIC METABOLIC PANEL WITH GFR - Abnormal; Notable for the following components:   Glucose, Bld 182 (*)    Calcium 8.7 (*)    All other components within normal limits    EKG: None  Radiology: CT Head Wo Contrast Result Date: 11/14/2023 EXAM: CT HEAD WITHOUT CONTRAST  11/14/2023 02:37:07 PM TECHNIQUE: CT of the head was performed without the administration of intravenous contrast. Automated exposure control, iterative reconstruction, and/or weight based adjustment of the mA/kV was utilized to reduce the radiation dose to as low as reasonably achievable. COMPARISON: CT head dated 06/21/2012. CLINICAL HISTORY: Neuro deficit, acute, stroke suspected. FINDINGS: BRAIN AND VENTRICLES: No acute intracranial hemorrhage. No evidence of acute infarction. Mild scattered white matter hypodensities which are nonspecific but most commonly represent chronic microvascular ischemic changes. No hydrocephalus. No extra-axial collection. No mass effect or midline shift. ORBITS: No acute abnormality. SINUSES: No acute abnormality. SOFT TISSUES AND SKULL: No acute soft tissue abnormality. Chronic left zygomatic arch asymmetry/ deformity. Chronic appearing bilateral anterior nasal bone deformities. No skull fracture. IMPRESSION: 1. No acute intracranial hemorrhage or evidence of acute infarction. Electronically signed by: prentice spade 11/14/2023 03:09 PM EST RP Workstation: [id number]     Procedures   Medications Ordered in the ED - No data to display                                  Medical Decision Making Eric Wood is a 68 y.o. male here presenting with right facial droop.  Patient likely has Bell's palsy.  I reviewed labs and CT scan done in triage and they were unremarkable.  I think his trouble speaking likely is from inability to move his tongue from a Bell's palsy.  I do not think he has a stroke right now.  Will  send home with steroids and Valtrex.    Problems Addressed: Bell's palsy: acute illness or injury  Amount and/or Complexity of Data Reviewed Labs: ordered. Decision-making details documented in ED Course. Radiology: ordered and independent interpretation performed. Decision-making details documented in ED Course.    Final diagnoses:  None    ED Discharge Orders     None          Patt Alm Macho, MD 11/14/23 1654

## 2023-11-14 NOTE — ED Notes (Signed)
 ED provider informed about patient, provider to assess patient for Bells Palsey. No orders to be placed at this time.

## 2023-11-14 NOTE — Discharge Instructions (Addendum)
 You have Bell's palsy.  I have prescribed steroids and Valtrex  Your blood sugar may be elevated so please check it often  Please use artificial tears to the right eye and during the night you may need to apply eye patch  Follow-up with your doctor  Return to ER if you have worse facial droop or trouble speaking

## 2023-11-14 NOTE — ED Triage Notes (Signed)
 Pt arrives via POV. PT reports yesterday around 1900, he had trouble drinking out of a straw and then noticed he had a facial droop. Pt reports some watering of right eye and his right eyebrow does not move up as far as the left does. PT arrives AxOx4. Speech is slightly slurred, no other focal neurological deficits noted. Denies headache or dizziness. VAN negative.

## 2023-11-14 NOTE — ED Provider Triage Note (Signed)
 Emergency Medicine Provider Triage Evaluation Note  Eric Wood , a 68 y.o. male  was evaluated in triage.  Pt complains of R sided facial droop starting at approximately 1900 last night. Noticing that he could not drink out of a straw. Has had Bells Palsy in the past. Also concerned for R ear pain.   No Hx of strokes.  Denies fever, rashes, headaches, vision changes, blurry vision, vertigo, chest pain, shortness of breath, n/v/d, dysuria.   Review of Systems  Positive: N/a Negative: N/a  Physical Exam  BP 133/89 (BP Location: Left Arm)   Pulse 76   Temp 98 F (36.7 C) (Oral)   Resp 16   SpO2 99%  Gen:   Awake, no distress   Resp:  Normal effort  MSK:   Moves extremities without difficulty  Other:  Is able to move eyebrows bilaterally L>R   Medical Decision Making  Medically screening exam initiated at 2:05 PM.  Appropriate orders placed.  Eric Wood was informed that the remainder of the evaluation will be completed by another provider, this initial triage assessment does not replace that evaluation, and the importance of remaining in the ED until their evaluation is complete.     Beola Terrall RAMAN, [place] 11/14/23 1408

## 2023-11-20 ENCOUNTER — Ambulatory Visit: Admitting: Podiatry

## 2023-11-20 NOTE — Progress Notes (Signed)
  Subjective:  Patient ID: Eric Wood, male    DOB: 09-19-1955,  MRN: [id number]  Sharod Petsch presents to clinic today for at risk foot care with history of diabetic neuropathy and painful mycotic toenails of both feet that are difficult to trim. Pain interferes with daily activities and wearing enclosed shoe gear comfortably.  Chief Complaint  Patient presents with   Nail Problem    Thick painful toenails, 3 month follow up    New problem(s): None.   PCP is Claudene Prentice DELENA Mickey., FNP.  Allergies  Allergen Reactions   Empagliflozin     Other reaction(s): Decreased renal function   Metformin  Diarrhea    Review of Systems: Negative except as noted in the HPI.  Objective: No changes noted in today's physical examination. There were no vitals filed for this visit. Nikoli Nasser is a pleasant 68 y.o. male obese in NAD. AAO x 3.  Vascular Examination: Capillary refill time immediate b/l. Vascular status intact b/l with palpable pedal pulses. Pedal hair present b/l. No pain with calf compression b/l. Skin temperature gradient WNL b/l. No cyanosis or clubbing b/l. No ischemia or gangrene noted b/l.   Neurological Examination: Sensation grossly intact b/l with 10 gram monofilament. Vibratory sensation intact b/l. Pt has subjective symptoms of neuropathy.  Dermatological Examination: Pedal skin with normal turgor, texture and tone b/l.  No open wounds. No interdigital macerations.   Toenails 1-5 b/l thick, discolored, elongated with subungual debris and pain on dorsal palpation.   Pedal skin noted to be dry b/l feet.  No hyperkeratotic nor porokeratotic lesions.  Musculoskeletal Examination: Muscle strength 5/5 to all lower extremity muscle groups bilaterally. No pain, crepitus or joint limitation noted with ROM b/l LE. No gross bony pedal deformities b/l. Patient ambulates independently without assistive aids.  Radiographs: None  Assessment/Plan: 1. Pain due to onychomycosis of  toenails of both feet   2. Xerosis cutis   3. Diabetic peripheral neuropathy associated with type 2 diabetes mellitus (HCC)   -Patient was evaluated today. All questions/concerns addressed on today's visit. -Continue foot and shoe inspections daily. Monitor blood glucose per PCP/Endocrinologist's recommendations. -Patient to continue soft, supportive shoe gear daily. -Mycotic toenails 1-5 bilaterally were debrided in length and girth with sterile nail nippers and dremel without incident. -For dry skin, recommended daily use of Bag Balm Hand and Body Moistuizer which may be purchased at local drug store or on [employer]. -Patient/POA to call should there be question/concern in the interim.   Return in about 3 months (around 02/12/2024).  Delon LITTIE Merlin, DPM      [place] LOCATION: [address], [place] [address]                   Office [phone]   [facility] LOCATION: [address] [place], [place] [address] Office [phone]

## 2024-01-03 ENCOUNTER — Emergency Department (HOSPITAL_COMMUNITY)
Admission: EM | Admit: 2024-01-03 | Discharge: 2024-01-03 | Disposition: A | Discharge status: Home / Self Care | Attending: Emergency Medicine | Admitting: Emergency Medicine

## 2024-01-03 ENCOUNTER — Emergency Department (HOSPITAL_COMMUNITY)

## 2024-01-03 DIAGNOSIS — E119 Type 2 diabetes mellitus without complications: Secondary | ICD-10-CM | POA: Diagnosis not present

## 2024-01-03 DIAGNOSIS — Z7984 Long term (current) use of oral hypoglycemic drugs: Secondary | ICD-10-CM | POA: Insufficient documentation

## 2024-01-03 DIAGNOSIS — Z7982 Long term (current) use of aspirin: Secondary | ICD-10-CM | POA: Insufficient documentation

## 2024-01-03 DIAGNOSIS — I10 Essential (primary) hypertension: Secondary | ICD-10-CM | POA: Diagnosis not present

## 2024-01-03 DIAGNOSIS — R079 Chest pain, unspecified: Secondary | ICD-10-CM | POA: Diagnosis present

## 2024-01-03 LAB — TROPONIN T, HIGH SENSITIVITY
Troponin T High Sensitivity: <15 ng/L (ref 0–19)
Troponin T High Sensitivity: 15 ng/L (ref 0–19)

## 2024-01-03 LAB — BASIC METABOLIC PANEL WITH GFR
Anion gap: 11 (ref 5–15)
BUN: 13 mg/dL (ref 8–23)
CO2: 26 mmol/L (ref 22–32)
Calcium: 9.9 mg/dL (ref 8.9–10.3)
Chloride: 104 mmol/L (ref 98–111)
Creatinine, Ser: 1.08 mg/dL (ref 0.61–1.24)
GFR, Estimated: >60 mL/min
Glucose, Bld: 157 mg/dL — ABNORMAL HIGH (ref 70–99)
Potassium: 3.9 mmol/L (ref 3.5–5.1)
Sodium: 141 mmol/L (ref 135–145)

## 2024-01-03 LAB — CBC
HCT: 38.7 % — ABNORMAL LOW (ref 39.0–52.0)
Hemoglobin: 12.9 g/dL — ABNORMAL LOW (ref 13.0–17.0)
MCH: 29.6 pg (ref 26.0–34.0)
MCHC: 33.3 g/dL (ref 30.0–36.0)
MCV: 88.8 fL (ref 80.0–100.0)
Platelets: 360 K/uL (ref 150–400)
RBC: 4.36 MIL/uL (ref 4.22–5.81)
RDW: 13.4 % (ref 11.5–15.5)
WBC: 4.4 K/uL (ref 4.0–10.5)
nRBC: 0 % (ref 0.0–0.2)

## 2024-01-03 MED ORDER — LIDOCAINE 5 % EX PTCH
1.0000 | MEDICATED_PATCH | Freq: Once | CUTANEOUS | Status: DC
  Administered 2024-01-03: 1 via TRANSDERMAL
  Filled 2024-01-03: qty 1

## 2024-01-03 MED ORDER — IOHEXOL 350 MG/ML SOLN
80.0000 mL | Freq: Once | INTRAVENOUS | Status: AC | PRN
  Administered 2024-01-03: 80 mL via INTRAVENOUS

## 2024-01-03 MED ORDER — ACETAMINOPHEN 325 MG PO TABS
650.0000 mg | ORAL_TABLET | Freq: Once | ORAL | Status: AC
  Administered 2024-01-03: 650 mg via ORAL
  Filled 2024-01-03: qty 2

## 2024-01-03 MED ORDER — METHOCARBAMOL 500 MG PO TABS
500.0000 mg | ORAL_TABLET | Freq: Once | ORAL | Status: AC
  Administered 2024-01-03: 500 mg via ORAL
  Filled 2024-01-03: qty 1

## 2024-01-03 NOTE — ED Provider Notes (Signed)
 " [place] EMERGENCY DEPARTMENT AT [facility] Provider Note   CSN: [id number] Arrival date & time: 01/03/24  1023     Patient presents with: Chest Pain   Eric Wood is a 69 y.o. male.   69 year old male presenting emergency department with chest pain.  Reports symptoms x 2 days, central chest rating toward his back also notes some pain down to his arms as well.  Reports symptoms primarily with movement.  Pain described as tightness.  No numbness tingling changes in sensation.  Does have some chronic neck pain.   Chest Pain      Prior to Admission medications  Medication Sig Start Date End Date Taking? Authorizing Provider  acetaminophen  (TYLENOL ) 325 MG tablet Take 325 mg by mouth every 6 (six) hours as needed for moderate pain.    [provider]  aspirin  EC 325 MG EC tablet Take 1 tablet (325 mg total) by mouth 2 (two) times daily after a meal. 06/12/14   Vernetta Lonni GRADE, MD  atenolol  (TENORMIN ) 25 MG tablet Take 25 mg by mouth every morning.     [provider]  benzoyl peroxide 10 % gel Apply topically. 05/17/16   [provider]  clotrimazole  (LOTRIMIN ) 1 % cream Apply to both feet and between toes twice daily for 4 weeks. 08/11/18   Gaynel Delon CROME, DPM  dextrose  (GLUTOSE) 40 % GEL Take by mouth. 06/24/19   [provider]  gabapentin  (NEURONTIN ) 300 MG capsule Take 300 mg by mouth 2 (two) times daily.    [provider]  glipiZIDE  (GLUCOTROL ) 10 MG tablet Take 10 mg by mouth 2 (two) times daily before a meal.    [provider]  hydrochlorothiazide (HYDRODIURIL) 25 MG tablet TAKE 1 TABLET BY MOUTH ONCE DAILY FOR HIGH BLOOD PRESSURE FOR 90 DAYS 06/30/18   [provider]  hydrocortisone 2.5 % cream Apply topically. 05/17/16   [provider]  insulin  aspart (NOVOLOG ) 100 UNIT/ML injection Inject into the skin. 10/31/19   [provider]  insulin  glargine (LANTUS) 100 UNIT/ML  injection Inject into the skin.    [provider]  ketoconazole (NIZORAL) 2 % shampoo Apply topically. 03/08/20   [provider]  lisinopril  (PRINIVIL ,ZESTRIL ) 10 MG tablet Take 20 mg by mouth every morning.     [provider]  lisinopril  (ZESTRIL ) 40 MG tablet TAKE 1 TABLET BY MOUTH ONCE DAILY FOR HIGH BLOOD PRESSURE FOR 90 DAYS 06/30/18   [provider]  lisinopril -hydrochlorothiazide (ZESTORETIC) 20-12.5 MG tablet Take by mouth. 10/31/19   [provider]  loratadine (CLARITIN) 10 MG tablet Take 1 tablet by mouth daily. 10/31/19   [provider]  meloxicam  (MOBIC ) 15 MG tablet Take 1 tablet (15 mg total) by mouth daily. 07/26/16   Vernetta Lonni GRADE, MD  metFORMIN  (GLUCOPHAGE ) 500 MG tablet Take 500 mg by mouth 2 (two) times daily with a meal.    [provider]  methylPREDNISolone  (MEDROL  DOSEPAK) 4 MG TBPK tablet Use as directed 11/14/23   Patt Alm Macho, MD  miconazole (ZEASORB-AF) 2 % powder Apply topically as needed for itching. 07/05/21   Gaynel Delon CROME, DPM  PRESCRIPTION MEDICATION Apply 1 application topically 2 (two) times daily as needed (dry skin). Lotion he receives from the [employer], Historical, MD  selenium sulfide (SELSUN) 2.5 % shampoo Apply topically. 10/27/19   [provider]  Semaglutide (OZEMPIC, 2 MG/DOSE, [place]) Inject 2 mg into the skin once a  week.    [provider]  senna (SENOKOT) 8.6 MG TABS tablet Take 1 tablet by mouth daily as needed for mild constipation.    [provider]  sertraline  (ZOLOFT ) 25 MG tablet Take 25 mg by mouth daily.    [provider]  sildenafil (VIAGRA) 100 MG tablet Take by mouth. 10/31/19   [provider]  simvastatin  (ZOCOR ) 10 MG tablet Take 10 mg by mouth at bedtime.    [provider]  simvastatin  (ZOCOR ) 40 MG tablet TAKE 1 TABLET BY MOUTH ONCE DAILY IN THE EVENING FOR 90 DAYS 06/30/18   [provider]  terbinafine (LAMISIL) 1 % cream Apply topically. 05/18/20   [provider]  tiZANidine  (ZANAFLEX ) 4 MG tablet TAKE 1 TABLET (4 MG TOTAL) BY MOUTH EVERY 8 (EIGHT) HOURS AS NEEDED FOR MUSCLE SPASMS. 08/17/16   Vernetta Lonni GRADE, MD  UNABLE TO FIND C Pap    [provider]  valACYclovir  (VALTREX ) 1000 MG tablet Take 1 tablet (1,000 mg total) by mouth 3 (three) times daily. 11/14/23   Patt Alm Macho, MD    Allergies: Empagliflozin and Metformin     Review of Systems  Cardiovascular:  Positive for chest pain.    Updated Vital Signs BP (!) 163/94   Pulse 74   Temp (!) 97.5 F (36.4 C) (Oral)   Resp 17   SpO2 95%   Physical Exam Vitals and nursing note reviewed.  Constitutional:      General: He is not in acute distress.    Appearance: He is obese. He is not toxic-appearing.  Cardiovascular:     Rate and Rhythm: Normal rate and regular rhythm.     Pulses:          Radial pulses are 2+ on the right side and 2+ on the left side.     Heart sounds: Normal heart sounds.  Pulmonary:     Effort: Pulmonary effort is normal.     Breath sounds: Normal breath sounds.  Chest:     Chest wall: No tenderness.  Musculoskeletal:     Right lower leg: No edema.     Left lower leg: No edema.  Skin:    General: Skin is warm and dry.     Capillary Refill: Capillary refill takes less than 2 seconds.  Neurological:     Mental Status: He is alert.  Psychiatric:        Mood and Affect: Mood normal.        Behavior: Behavior normal.     (all labs ordered are listed, but only abnormal results are displayed) Labs Reviewed  BASIC METABOLIC PANEL WITH GFR - Abnormal; Notable for the following components:      Result Value   Glucose, Bld 157 (*)    All other components within normal limits  CBC - Abnormal; Notable for the following components:   Hemoglobin 12.9 (*)    HCT 38.7 (*)    All other components within normal limits  TROPONIN T, HIGH SENSITIVITY   TROPONIN T, HIGH SENSITIVITY    EKG: EKG Interpretation Date/Time:  Friday January 03 2024 10:29:15 EST Ventricular Rate:  82 PR Interval:  185 QRS Duration:  89 QT Interval:  353 QTC Calculation: 413 R Axis:   15  Text Interpretation: Sinus arrhythmia Abnormal R-wave progression, early transition Confirmed by Neysa Clap [phone]) on 01/03/2024 5:28:19 PM  Radiology: CT Angio Chest/Abd/Pel for Dissection W and/or Wo Contrast Result Date: 01/03/2024 EXAM: CTA CHEST,  ABDOMEN AND PELVIS WITHOUT AND WITH CONTRAST 01/03/2024 04:11:00 PM TECHNIQUE: CTA of the chest was performed without and with the administration of intravenous contrast. CTA of the abdomen and pelvis was performed without and with the administration of intravenous contrast. 80 mL of iohexol (OMNIPAQUE) 350 MG/ML injection was administered. Multiplanar reformatted images are provided for review. MIP images are provided for review. Automated exposure control, iterative reconstruction, and/or weight based adjustment of the mA/kV was utilized to reduce the radiation dose to as low as reasonably achievable. COMPARISON: None available. CLINICAL HISTORY: Acute aortic syndrome (AAS) suspected. FINDINGS: VASCULATURE: AORTA: The thoracic and abdominal aorta are normal in caliber. No dissection identified. Mild atherosclerotic plaque of the aorta. No abdominal aortic aneurysm. PULMONARY ARTERIES: The main pulmonary artery is normal in caliber. No central pulmonary embolus. Limited evaluation distally due to timing of contrast. GREAT VESSELS OF AORTIC ARCH: No acute finding. No dissection. No arterial occlusion or significant stenosis. CELIAC TRUNK: Mild atherosclerotic plaque of the celiac artery. No occlusion or significant stenosis. SUPERIOR MESENTERIC ARTERY: Mild atherosclerotic plaque in the superior mesenteric artery. No occlusion or significant stenosis. INFERIOR MESENTERIC ARTERY: No acute finding. No occlusion or significant stenosis.  RENAL ARTERIES: Mild-to-moderate atherosclerotic plaque of the origin of the left renal artery. No occlusion or significant stenosis. ILIAC ARTERIES: No acute finding. No occlusion or significant stenosis. CHEST: MEDIASTINUM: No mediastinal lymphadenopathy. The heart and pericardium demonstrate no acute abnormality. At least 2-vessel coronary calcifications. LUNGS AND PLEURA: The lungs are without acute process. No focal consolidation or pulmonary edema. No evidence of pleural effusion or pneumothorax. THORACIC BONES AND SOFT TISSUES: Bilateral shoulder mild-to-moderate degenerative changes. No acute bone or soft tissue abnormality. ABDOMEN AND PELVIS: LIVER: The liver is unremarkable. GALLBLADDER AND BILE DUCTS: Calcified gallstone within the gallbladder lumen. No gallbladder wall thickening or pericholecystic fluid. No biliary ductal dilatation. SPLEEN: The spleen is unremarkable. PANCREAS: The pancreas is unremarkable. ADRENAL GLANDS: Bilateral adrenal glands demonstrate no acute abnormality. KIDNEYS, URETERS AND BLADDER: Hypodense lesions of the kidneys likely represent simple renal cysts. Simple renal cysts do not require additional follow-up unless clinically indicated due to signs/symptoms. No stones in the kidneys or ureters. No hydronephrosis. No perinephric or periureteral stranding. Urinary bladder is unremarkable. GI AND BOWEL: No small or large bowel thickening or dilatation. The appendix is unremarkable. Stomach and duodenal sweep demonstrate no acute abnormality. There is no bowel obstruction. No abnormal bowel wall thickening or distension. REPRODUCTIVE: The prostate is unremarkable. Reproductive organs are unremarkable. PERITONEUM AND RETROPERITONEUM: Small fat-containing right inguinal hernia. No ascites or free air. LYMPH NODES: No lymphadenopathy. ABDOMINAL BONES AND SOFT TISSUES: Bilateral hip total arthroplasty. Heterotopic ossification along the left and to a lesser extent right  arthroplasties. No acute fracture. Grade 2 anterolisthesis of L5 on S1 with severe degenerative changes at the L5-S1 level with complete loss of the intervertebral disc space, endplate sclerosis, and severe osseous neural foraminal stenosis. No severe osseous central canal stenosis. No acute soft tissue abnormality. IMPRESSION: 1. No evidence of acute aortic syndrome. 2. Mild atherosclerotic plaque. 3. No central pulmonary embolus, with limited evaluation of the distal pulmonary arteries due to timing of contrast. 4. Cholelithiasis without CT evidence of acute cholecystitis. 5. Small fat-containing right inguinal hernia. Electronically signed by: Morgane Naveau MD 01/03/2024 05:15 PM EST RP Workstation: [id number]   DG Chest 2 View Result Date: 01/03/2024 EXAM: 2 VIEW(S) XRAY OF THE CHEST 01/03/2024 12:01:38 PM COMPARISON: 08/17/2019 CLINICAL HISTORY: chest pain FINDINGS: LUNGS AND PLEURA: No focal pulmonary  opacity. No pleural effusion. No pneumothorax. HEART AND MEDIASTINUM: No acute abnormality of the cardiac and mediastinal silhouettes. BONES AND SOFT TISSUES: No acute osseous abnormality. IMPRESSION: 1. No acute cardiopulmonary pathology. Electronically signed by: Waddell Calk MD 01/03/2024 01:12 PM EST RP Workstation: [id number]     Procedures   Medications Ordered in the ED  lidocaine  (LIDODERM ) 5 % 1 patch (1 patch Transdermal Patch Applied 01/03/24 1543)  methocarbamol  (ROBAXIN ) tablet 500 mg (500 mg Oral Given 01/03/24 1544)  acetaminophen  (TYLENOL ) tablet 650 mg (650 mg Oral Given 01/03/24 1542)  iohexol (OMNIPAQUE) 350 MG/ML injection 80 mL (80 mLs Intravenous Contrast Given 01/03/24 1600)    Clinical Course as of 01/03/24 1734  Fri Jan 03, 2024  1728 CT Angio Chest/Abd/Pel for Dissection W and/or Wo Contrast MPRESSION: 1. No evidence of acute aortic syndrome. 2. Mild atherosclerotic plaque. 3. No central pulmonary embolus, with limited evaluation of the distal pulmonary arteries due to  timing of contrast. 4. Cholelithiasis without CT evidence of acute cholecystitis. 5. Small fat-containing right inguinal hernia.   [TY]    Clinical Course User Index [TY] Neysa Caron PARAS, DO                                 Medical Decision Making This is a 69 year old male complex past medical history to include obesity, hypertension, hyperlipidemia, diabetes presenting emergency department for chest pain.  EKG without ischemic changes.  Initial troponin no elevation.  Symptoms been ongoing for 2 days it sounds like.  It is radiating towards his back he does have a significantly elevated blood pressure.  Will rule out dissection with CTA.  Chest x-ray however without widened mediastinum.  No pneumonia pneumothorax on my independent review.  Labs with no leukocytosis.  Stable anemia.  No other significant metabolic derangements normal kidney function.  His pain is seemingly worsened with movement which would point towards a more musculoskeletal etiology.  Was treated with Tylenol  lidocaine  patch and muscle relaxer.  Care signed out to afternoon team disposition pending CTA  Amount and/or Complexity of Data Reviewed External Data Reviewed:     Details: No prior cardiac testing Labs: ordered. Decision-making details documented in ED Course. Radiology: ordered and independent interpretation performed. Decision-making details documented in ED Course. ECG/medicine tests: independent interpretation performed.  Risk OTC drugs. Prescription drug management. Decision regarding hospitalization. Diagnosis or treatment significantly limited by social determinants of health.      Final diagnoses:  Chest pain, unspecified type    ED Discharge Orders          Ordered    Ambulatory referral to Cardiology       Comments: If you have not heard from the Cardiology office within the next 72 hours please call [phone].   01/03/24 1708               Neysa Caron PARAS, DO 01/03/24  1734  "

## 2024-01-03 NOTE — ED Triage Notes (Addendum)
 C/o chest tightness only with movement. Cannot get comfortable. Bilateral arm pain when bending over. States this started right after getting a new cpap machine. Currently feels SOB due to walking in from the parking. Chronic back and neck pain.

## 2024-01-03 NOTE — ED Provider Notes (Signed)
" °  Physical Exam  BP (!) 148/121 (BP Location: Left Arm)   Pulse 68   Temp 98.3 F (36.8 C) (Oral)   Resp (!) 22   SpO2 96%   Physical Exam  Procedures  Procedures  ED Course / MDM   Clinical Course as of 01/03/24 2030  Fri Jan 03, 2024  1728 CT Angio Chest/Abd/Pel for Dissection W and/or Wo Contrast MPRESSION: 1. No evidence of acute aortic syndrome. 2. Mild atherosclerotic plaque. 3. No central pulmonary embolus, with limited evaluation of the distal pulmonary arteries due to timing of contrast. 4. Cholelithiasis without CT evidence of acute cholecystitis. 5. Small fat-containing right inguinal hernia.   [TY]    Clinical Course User Index [TY] Neysa Caron PARAS, DO   Medical Decision Making Amount and/or Complexity of Data Reviewed Labs: ordered. Radiology: ordered. Decision-making details documented in ED Course.  Risk OTC drugs. Prescription drug management.   Patient signed out pending delta troponin.  Second troponin 15.  Patient not have any chest pain at this time, likely musculoskeletal.  He will follow-up with cardiology.      Mannie Pac T, DO 01/03/24 2030  "

## 2024-01-03 NOTE — ED Notes (Addendum)
 Patient states chest pain started two days ago with tight chest.  Noted elevated BP.

## 2024-01-03 NOTE — Discharge Instructions (Signed)
 Please follow-up with your primary doctor and cardiology.  Return for fevers, chills, worsening chest pain, difficulty breathing, lightheadedness, passout or any new or worsening symptoms that are concerning to you.

## 2024-01-06 NOTE — Progress Notes (Signed)
 " Cardiology Office Note:   Date:  01/06/2024  ID:  Eric Wood, DOB 1955-01-07, MRN [id number] PCP: Claudene Prentice DELENA Mickey., FNP  [facility] Providers Cardiologist:  Georganna Archer, MD { Chief Complaint:  Chief Complaint  Patient presents with   Chest Pain      History of Present Illness:   Eric Wood is a 69 y.o. male with a PMH of HTN, HLD, DM2, OSA who presents as a new patient referral for the evaluation of chest pain.  The patient reports that he was in his usual state of health until New Year's Eve when he developed some chest tightness.  His symptoms of chest tightness persisted through the new year prompting him to ultimately go to the ED for evaluation.  He describes the chest tightness as being central, but associated with diffuse body aches and pains and a feeling of anxiety.  No associated shortness of breath, nausea, vomiting, diaphoresis, PND, orthopnea, swelling, palpitations.  He has not had this type of chest discomfort before.  He presented to the [facility] ED on 1/2 and his evaluation was unremarkable.  Troponins ruled out and EKG was nonischemic.  He underwent a CT aortic dissection protocol which was negative for PE and negative for dissection.  Mild coronary calcifications were observed.  The patient shares that he has a family history of myocardial infarction which his dad suffered.  He denies tobacco, alcohol, and illicit drug use.  He served in [employer] and is a retired Investment banker, operational.  He lives in [place] with several family members.  He takes all his medications as prescribed without side effect.  He has no other complaints.   Past Medical History:  Diagnosis Date   Arthritis    Back pain    acupuncture   Diabetes mellitus    type 2   Family history of adverse reaction to anesthesia    Severe PONV   Hypercholesteremia    under control   Hypertension    Sleep apnea    sometimes wear CPAP      Studies Reviewed:    EKG: I independently reviewed  the patient's EKG and it shows NSR with occasional PACs           Risk Assessment/Calculations:              Physical Exam:     VS:  BP 124/62   Pulse 86   Ht 5' 9 (1.753 m)   Wt 230 lb 6.4 oz (104.5 kg)   SpO2 96%   BMI 34.02 kg/m      Wt Readings from Last 3 Encounters:  08/17/19 238 lb (108 kg)  12/28/16 226 lb (102.5 kg)  09/07/14 209 lb (94.8 kg)     GEN: Well nourished, well developed, in no acute distress NECK: No JVD; No carotid bruits CARDIAC: RRR, no murmurs, rubs, gallops RESPIRATORY:  Clear to auscultation without rales, wheezing or rhonchi  ABDOMEN: Soft, non-tender, non-distended, normal bowel sounds EXTREMITIES:  Warm and well perfused, no edema; No deformity, 2+ radial pulses PSYCH: Normal mood and affect   Assessment & Plan   #Possible Cardiac Chest Pain - The patient's chest pain episode was not classic for cardiac chest pain; however, the patient is intermediate risk for obstructive coronary disease given his history of HTN, diabetes, and obesity. - Given his increased risk I think that ruling out obstructive coronary disease is reasonable. - Will obtain a coronary CTA and a complete echocardiogram for evaluation.  CCTA Complete echocardiogram Follow-up as needed depending on testing results  #HTN - BP is at goal.  No changes. Continue lisinopril /HCTZ combination pill 40 mg / 25 mg  #HLD - Goal LDL <70 for diabetes. Continue atorvastatin 40 mg daily Check lipid panel and lipoprotein a         This note was written with the assistance of a dictation microphone or AI dictation software. Please excuse any typos or grammatical errors.   Signed, Georganna Archer, MD 01/06/2024 5:41 PM    [facility]  "

## 2024-01-07 ENCOUNTER — Other Ambulatory Visit (HOSPITAL_COMMUNITY): Payer: Self-pay

## 2024-01-07 ENCOUNTER — Ambulatory Visit
Attending: Student in an Organized Health Care Education/Training Program | Admitting: Student in an Organized Health Care Education/Training Program

## 2024-01-07 ENCOUNTER — Encounter: Payer: Self-pay | Admitting: Student in an Organized Health Care Education/Training Program

## 2024-01-07 VITALS — BP 124/62 | HR 86 | Ht 69.0 in | Wt 230.4 lb

## 2024-01-07 DIAGNOSIS — R072 Precordial pain: Secondary | ICD-10-CM

## 2024-01-07 DIAGNOSIS — E782 Mixed hyperlipidemia: Secondary | ICD-10-CM

## 2024-01-07 MED ORDER — METOPROLOL TARTRATE 100 MG PO TABS
100.0000 mg | ORAL_TABLET | Freq: Once | ORAL | 0 refills | Status: AC
  Filled 2024-01-07: qty 1, 1d supply, fill #0

## 2024-01-07 NOTE — Patient Instructions (Addendum)
 Medication Instructions:   ON DAY OF CT   metoprolol  tartrate (LOPRESSOR ) 100 MG tablet         Take 1 tablet (100 mg total) by mouth once. Take 90-120 minutes prior to scan. Hold for SBP less than 110.    *If you need a refill on your cardiac medications before your next appointment, please call your pharmacy*  Lab Work: Lipid panel  LP(a)  If you have labs (blood work) drawn today and your tests are completely normal, you will receive your results only by: MyChart Message (if you have MyChart) OR A paper copy in the mail If you have any lab test that is abnormal or we need to change your treatment, we will call you to review the results.  Testing/Procedures: Echocardiogram  Your physician has requested that you have an echocardiogram. Echocardiography is a painless test that uses sound waves to create images of your heart. It provides your doctor with information about the size and shape of your heart and how well your hearts chambers and valves are working. This procedure takes approximately one hour. There are no restrictions for this procedure. Please do NOT wear cologne, perfume, aftershave, or lotions (deodorant is allowed). Please arrive 15 minutes prior to your appointment time.  Please note: We ask at that you not bring children with you during ultrasound (echo/ vascular) testing. Due to room size and safety concerns, children are not allowed in the ultrasound rooms during exams. Our front office staff cannot provide observation of children in our lobby area while testing is being conducted. An adult accompanying a patient to their appointment will only be allowed in the ultrasound room at the discretion of the ultrasound technician under special circumstances. We apologize for any inconvenience.  CORONARY CTA    Your cardiac CT will be scheduled at one of the below locations:   [facility] [address]  [place], [place] [address]  If  scheduled at the Heart and V[facility] at [address], please enter the parking lot using the [address] entrance and use the FREE valet service at the patient drop-off area. Enter the building and check-in with registration on the main floor.  Please follow these instructions carefully (unless otherwise directed):  An IV will be required for this test and Nitroglycerin will be given.  Hold all erectile dysfunction medications at least 3 days (72 hrs) prior to test. (Ie viagra, cialis, sildenafil, tadalafil, etc)   On the Night Before the Test: Be sure to Drink plenty of water. Do not consume any caffeinated/decaffeinated beverages or chocolate 12 hours prior to your test. Do not take any antihistamines 12 hours prior to your test.  On the Day of the Test: Drink plenty of water until 1 hour prior to the test. Do not eat any food 1 hour prior to test. You may take your regular medications prior to the test.  Take metoprolol  (Lopressor ) two hours prior to test. If you take Furosemide/Hydrochlorothiazide/Spironolactone/Chlorthalidone, please HOLD on the morning of the test. Patients who wear a continuous glucose monitor MUST remove the device prior to scanning. FEMALES- please wear underwire-free bra if available, avoid dresses & tight clothing      After the Test: Drink plenty of water. After receiving IV contrast, you may experience a mild flushed feeling. This is normal. On occasion, you may experience a mild rash up to 24 hours after the test. This is not dangerous. If this occurs, you can take  Benadryl  25 mg, Zyrtec, Claritin, or Allegra and increase your fluid intake. (Patients taking Tikosyn should avoid Benadryl , and may take Zyrtec, Claritin, or Allegra) If you experience trouble breathing, this can be serious. If it is severe call 911 IMMEDIATELY. If it is mild, please call our office.  We will call to schedule your test 2-4 weeks out understanding that some insurance  companies will need an authorization prior to the service being performed.   For more information and frequently asked questions, please visit our website : [email or web address]  For non-scheduling related questions, please contact the cardiac imaging nurse navigator should you have any questions/concerns: Cardiac Imaging Nurse Navigators Direct Office Dial: [phone]   For scheduling needs, including cancellations and rescheduling, please call Brittany, [phone].    Follow-Up: At [facility], you and your health needs are our priority.  As part of our continuing mission to provide you with exceptional heart care, our providers are all part of one team.  This team includes your primary Cardiologist (physician) and Advanced Practice Providers or APPs (Physician Assistants and Nurse Practitioners) who all work together to provide you with the care you need, when you need it.  Your next appointment:   As needed   Provider:   Georganna Archer, MD

## 2024-01-08 ENCOUNTER — Ambulatory Visit: Payer: Self-pay | Admitting: Student in an Organized Health Care Education/Training Program

## 2024-01-08 LAB — LIPID PANEL
Chol/HDL Ratio: 2.8 ratio (ref 0.0–5.0)
Cholesterol, Total: 98 mg/dL — ABNORMAL LOW (ref 100–199)
HDL: 35 mg/dL — ABNORMAL LOW
LDL Chol Calc (NIH): 46 mg/dL (ref 0–99)
Triglycerides: 83 mg/dL (ref 0–149)
VLDL Cholesterol Cal: 17 mg/dL (ref 5–40)

## 2024-01-08 LAB — LIPOPROTEIN A (LPA): Lipoprotein (a): 74.8 nmol/L

## 2024-01-10 ENCOUNTER — Ambulatory Visit (HOSPITAL_COMMUNITY)
Admission: RE | Admit: 2024-01-10 | Discharge: 2024-01-10 | Disposition: A | Discharge status: Home / Self Care | Source: Ambulatory Visit | Attending: Student in an Organized Health Care Education/Training Program | Admitting: Student in an Organized Health Care Education/Training Program

## 2024-01-10 DIAGNOSIS — R072 Precordial pain: Secondary | ICD-10-CM | POA: Diagnosis not present

## 2024-01-10 LAB — ECHOCARDIOGRAM COMPLETE
Area-P 1/2: 3.03 cm2
S' Lateral: 3 cm

## 2024-02-03 ENCOUNTER — Telehealth (HOSPITAL_COMMUNITY): Payer: Self-pay | Admitting: *Deleted

## 2024-02-03 NOTE — Telephone Encounter (Signed)
 Patient returning call about his upcoming cardiac imaging study; pt verbalizes understanding of appt date/time, parking situation and where to check in, pre-test NPO status and medications ordered, and verified current allergies; name and call back number provided for further questions should they arise  Chase Copping RN Navigator Cardiac Imaging [facility] [phone] office [phone] cell

## 2024-02-03 NOTE — Telephone Encounter (Signed)
 Attempted to call patient regarding upcoming cardiac CT appointment. Left message on voicemail with name and callback number Sid Seats RN Navigator Cardiac Imaging [facility] [phone] Office

## 2024-02-04 ENCOUNTER — Ambulatory Visit (HOSPITAL_COMMUNITY)
Admission: RE | Admit: 2024-02-04 | Discharge: 2024-02-04 | Disposition: A | Source: Ambulatory Visit | Attending: Student in an Organized Health Care Education/Training Program | Admitting: Student in an Organized Health Care Education/Training Program

## 2024-02-04 DIAGNOSIS — R072 Precordial pain: Secondary | ICD-10-CM

## 2024-02-04 MED ORDER — NITROGLYCERIN 0.4 MG SL SUBL
0.8000 mg | SUBLINGUAL_TABLET | Freq: Once | SUBLINGUAL | Status: AC
  Administered 2024-02-04: 0.8 mg via SUBLINGUAL

## 2024-02-04 MED ORDER — IOHEXOL 350 MG/ML SOLN
100.0000 mL | Freq: Once | INTRAVENOUS | Status: AC | PRN
  Administered 2024-02-04: 100 mL via INTRAVENOUS

## 2024-02-26 ENCOUNTER — Ambulatory Visit: Admitting: Podiatry
